# Patient Record
Sex: Female | Born: 1950
Health system: Southern US, Community
[De-identification: ages and names within clinical notes are randomized; demographics above are authoritative.]

## PROBLEM LIST (undated history)

## (undated) DIAGNOSIS — T7840XA Allergy, unspecified, initial encounter: Secondary | ICD-10-CM

## (undated) DIAGNOSIS — M1990 Unspecified osteoarthritis, unspecified site: Secondary | ICD-10-CM

## (undated) DIAGNOSIS — K219 Gastro-esophageal reflux disease without esophagitis: Secondary | ICD-10-CM

## (undated) DIAGNOSIS — C801 Malignant (primary) neoplasm, unspecified: Secondary | ICD-10-CM

## (undated) DIAGNOSIS — B029 Zoster without complications: Secondary | ICD-10-CM

## (undated) DIAGNOSIS — Z5189 Encounter for other specified aftercare: Secondary | ICD-10-CM

## (undated) DIAGNOSIS — E663 Overweight: Secondary | ICD-10-CM

## (undated) DIAGNOSIS — G4700 Insomnia, unspecified: Secondary | ICD-10-CM

## (undated) DIAGNOSIS — M545 Low back pain, unspecified: Secondary | ICD-10-CM

## (undated) DIAGNOSIS — Z9889 Other specified postprocedural states: Secondary | ICD-10-CM

## (undated) DIAGNOSIS — R300 Dysuria: Secondary | ICD-10-CM

## (undated) DIAGNOSIS — M47816 Spondylosis without myelopathy or radiculopathy, lumbar region: Secondary | ICD-10-CM

## (undated) DIAGNOSIS — R112 Nausea with vomiting, unspecified: Secondary | ICD-10-CM

## (undated) DIAGNOSIS — I10 Essential (primary) hypertension: Secondary | ICD-10-CM

## (undated) DIAGNOSIS — R635 Abnormal weight gain: Secondary | ICD-10-CM

## (undated) DIAGNOSIS — M1611 Unilateral primary osteoarthritis, right hip: Secondary | ICD-10-CM

## (undated) DIAGNOSIS — E785 Hyperlipidemia, unspecified: Secondary | ICD-10-CM

## (undated) HISTORY — PX: BACK SURGERY: SHX140

## (undated) HISTORY — PX: JOINT REPLACEMENT: SHX530

## (undated) HISTORY — DX: Gastro-esophageal reflux disease without esophagitis: K21.9

## (undated) HISTORY — PX: CERVICAL DISCECTOMY: SHX98

## (undated) HISTORY — PX: TONSILLECTOMY: SUR1361

## (undated) HISTORY — DX: Overweight: E66.3

## (undated) HISTORY — PX: POLYPECTOMY: SHX149

## (undated) HISTORY — DX: Spondylosis without myelopathy or radiculopathy, lumbar region: M47.816

## (undated) HISTORY — DX: Abnormal weight gain: R63.5

## (undated) HISTORY — DX: Encounter for other specified aftercare: Z51.89

## (undated) HISTORY — DX: Zoster without complications: B02.9

## (undated) HISTORY — DX: Hyperlipidemia, unspecified: E78.5

## (undated) HISTORY — DX: Allergy, unspecified, initial encounter: T78.40XA

## (undated) HISTORY — DX: Other specified postprocedural states: Z98.890

## (undated) HISTORY — DX: Nausea with vomiting, unspecified: R11.2

## (undated) HISTORY — DX: Malignant (primary) neoplasm, unspecified: C80.1

## (undated) HISTORY — DX: Dysuria: R30.0

## (undated) HISTORY — DX: Low back pain, unspecified: M54.50

## (undated) HISTORY — PX: COLONOSCOPY: SHX174

## (undated) HISTORY — DX: Insomnia, unspecified: G47.00

---

## 1999-03-21 ENCOUNTER — Encounter: Payer: Self-pay | Admitting: Obstetrics and Gynecology

## 1999-03-21 ENCOUNTER — Encounter: Admission: RE | Admit: 1999-03-21 | Discharge: 1999-03-21 | Payer: Self-pay | Admitting: Obstetrics and Gynecology

## 1999-12-10 ENCOUNTER — Other Ambulatory Visit: Admission: RE | Admit: 1999-12-10 | Discharge: 1999-12-10 | Payer: Self-pay | Admitting: Obstetrics and Gynecology

## 1999-12-10 ENCOUNTER — Encounter (INDEPENDENT_AMBULATORY_CARE_PROVIDER_SITE_OTHER): Payer: Self-pay | Admitting: Specialist

## 2000-11-23 ENCOUNTER — Encounter: Admission: RE | Admit: 2000-11-23 | Discharge: 2000-11-23 | Payer: Self-pay | Admitting: Obstetrics and Gynecology

## 2000-11-23 ENCOUNTER — Encounter: Payer: Self-pay | Admitting: Obstetrics and Gynecology

## 2001-01-06 ENCOUNTER — Other Ambulatory Visit: Admission: RE | Admit: 2001-01-06 | Discharge: 2001-01-06 | Payer: Self-pay | Admitting: Internal Medicine

## 2001-05-12 HISTORY — PX: BACK SURGERY: SHX140

## 2002-06-13 ENCOUNTER — Encounter: Payer: Self-pay | Admitting: Obstetrics and Gynecology

## 2002-06-13 ENCOUNTER — Encounter: Admission: RE | Admit: 2002-06-13 | Discharge: 2002-06-13 | Payer: Self-pay | Admitting: Internal Medicine

## 2003-04-24 ENCOUNTER — Encounter: Admission: RE | Admit: 2003-04-24 | Discharge: 2003-04-24 | Payer: Self-pay | Admitting: Internal Medicine

## 2003-05-01 ENCOUNTER — Ambulatory Visit (HOSPITAL_COMMUNITY): Admission: RE | Admit: 2003-05-01 | Discharge: 2003-05-01 | Payer: Self-pay | Admitting: Internal Medicine

## 2003-06-16 ENCOUNTER — Ambulatory Visit (HOSPITAL_COMMUNITY): Admission: RE | Admit: 2003-06-16 | Discharge: 2003-06-16 | Payer: Self-pay | Admitting: Neurosurgery

## 2003-12-14 ENCOUNTER — Encounter: Admission: RE | Admit: 2003-12-14 | Discharge: 2003-12-14 | Payer: Self-pay | Admitting: Internal Medicine

## 2004-02-28 ENCOUNTER — Encounter: Admission: RE | Admit: 2004-02-28 | Discharge: 2004-02-28 | Payer: Self-pay | Admitting: Obstetrics and Gynecology

## 2005-04-09 ENCOUNTER — Encounter: Admission: RE | Admit: 2005-04-09 | Discharge: 2005-04-09 | Payer: Self-pay | Admitting: Obstetrics and Gynecology

## 2006-09-14 ENCOUNTER — Encounter: Admission: RE | Admit: 2006-09-14 | Discharge: 2006-09-14 | Payer: Self-pay | Admitting: Obstetrics and Gynecology

## 2007-09-23 ENCOUNTER — Encounter: Admission: RE | Admit: 2007-09-23 | Discharge: 2007-09-23 | Payer: Self-pay | Admitting: Obstetrics and Gynecology

## 2009-09-19 ENCOUNTER — Encounter: Admission: RE | Admit: 2009-09-19 | Discharge: 2009-09-19 | Payer: Self-pay | Admitting: Obstetrics and Gynecology

## 2010-09-27 NOTE — Op Note (Signed)
NAME:  ALLIYAH, ROESLER                        ACCOUNT NO.:  [id number]   MEDICAL RECORD NO.:  [id number]                   PATIENT TYPE:  OIB   LOCATION:  3014                                 FACILITY:  [facility]   PHYSICIAN:  Kathaleen Maser. Pool, M.D.                 DATE OF BIRTH:  18-Oct-1950   DATE OF PROCEDURE:  06/16/2003  DATE OF DISCHARGE:  06/16/2003                                 OPERATIVE REPORT   PREOPERATIVE DIAGNOSES:  1. Right C6-7 spondylosis with radiculopathy.  2. Right C7-T1 herniated nucleus pulposus with radiculopathy.   POSTOPERATIVE DIAGNOSES:  1. Right C6-7 spondylosis with radiculopathy.  2. Right C7-T1 herniated nucleus pulposus with radiculopathy.   PROCEDURE:  1. Right C6-7 laminotomy and foraminotomy.  2. Right C7-T1 laminotomy and foraminotomy with microdiskectomy.   SURGEON:  Kathaleen Maser. Pool, M.D.   ASSISTANT:  Tia Alert, MD   ANESTHESIA:  General endotracheal anesthesia.   INDICATIONS FOR PROCEDURE:  Ms. Tortorelli is a 60 year old female with a history  of  neck  and right upper extremity pain, paresthesias and weakness with a  right-sided C7 and C8 radiculopathy. The patient has failed conservative  management. Workups demonstrated evidence of severe  spondylosis off to the  left side at C6-7 with compression of the C7 nerve root and evidence of an  acute rightward C7-T1  disk herniation with compression of the right-sided  C8 nerve root. The patient has been counseled as to her options. She has  decided to  proceed with a 2 level  cervical foraminotomy and  microdiskectomy.   DESCRIPTION OF PROCEDURE:  The patient was brought to the operating room and  placed on the table in the supine position. After an adequate level of  anesthesia was achieved, the patient was placed prone onto bolsters with the  head fixed in a [place] pin  head rest. The patient's posterior  cervical  region was prepped and draped sterilely.   A #10 blade was used to make a  linear skin incision overlying the C6-7 and  T1 levels. This was carried down sharply in the midline. A subperiosteal  dissection was performed exposing the lamina and facet joints of C6, C7 and  T1. A deep self-retaining retractor was placed. Intraoperative x-ray was  taken and the levels were confirmed.   The laminotomy and foraminotomy were then performed using the high-speed  drill and the Kerrison rongeurs to remove the inferior  aspect of the lamina  above, the medial aspect of the facet joint and the superior aspect of the  lamina below at both the C6-7 and C7-T1 levels. The ligament of Flavum was  then elevated  and dissected in a piecemeal fashion. The underlying  thecal  sac and exiting nerve root were identified.   The microscope was brought into the field and used for microdissection.  Starting first at the C6-7, the nerve root had a  superior  course into the  foramen. There was a large osteophytic protrusion superiorly compressing the  nerve root. This osteophytic protrusion was removed using small  osteotomes.  This completely decompressed the right-sided C7 nerve root. There was no  evidence of any further  compression. There was a small  element  of acute  disk  herniation which was also  resected. At this point blunt probes passed  easily along the course of the nerve root.   Attention was then placed at the C7-T1 level. Once again the C8 nerve root  had a very superior  course. Working superior to the nerve root the epidural  venous plexus was coagulated and cut. A large amount of free disk herniation  was encouraged and dissected free using  a blunt nerve hook. This completely  decompressed the right-sided C8 nerve root. The remainder of the disk  space  was flat.   The wound was then irrigated with antibiotic solution. Gelfoam was placed  top way above the levels for hemostasis. The microscope and retractors were  removed. Hemostasis was mostly achieved with  electrocautery. The wound was  then closed in layers with Vicryl sutures. Steri-Strips and a sterile  dressing were applied.   There were no apparent complications. The patient tolerated the procedure  well and she returned to the recovery room postoperatively.                                               Henry A. Pool, M.D.    HAP/MEDQ  D:  06/16/2003  T:  06/17/2003  Job:  [id number]

## 2015-02-07 ENCOUNTER — Other Ambulatory Visit: Payer: Self-pay | Admitting: Internal Medicine

## 2015-02-07 DIAGNOSIS — M5136 Other intervertebral disc degeneration, lumbar region: Secondary | ICD-10-CM

## 2015-02-22 ENCOUNTER — Other Ambulatory Visit: Payer: Self-pay

## 2015-03-08 ENCOUNTER — Ambulatory Visit
Admission: RE | Admit: 2015-03-08 | Discharge: 2015-03-08 | Disposition: A | Discharge status: Home / Self Care | Payer: Medicare Other | Source: Ambulatory Visit | Attending: Internal Medicine | Admitting: Internal Medicine

## 2015-03-08 DIAGNOSIS — M5136 Other intervertebral disc degeneration, lumbar region: Secondary | ICD-10-CM

## 2015-07-17 ENCOUNTER — Other Ambulatory Visit: Payer: Self-pay

## 2015-07-17 DIAGNOSIS — Z1231 Encounter for screening mammogram for malignant neoplasm of breast: Secondary | ICD-10-CM

## 2015-08-02 ENCOUNTER — Ambulatory Visit
Admission: RE | Admit: 2015-08-02 | Discharge: 2015-08-02 | Disposition: A | Discharge status: Home / Self Care | Payer: Medicare Other | Source: Ambulatory Visit

## 2015-08-02 DIAGNOSIS — Z1231 Encounter for screening mammogram for malignant neoplasm of breast: Secondary | ICD-10-CM

## 2016-01-10 NOTE — Pre-Procedure Instructions (Signed)
    Wendy Ray  01/10/2016      [id number] [place], [place] [address] [place] [address] Phone: [phone] Fax: [phone]    Your procedure is scheduled on 01-22-2016  Tuesday .  Report to [facility] Admitting at 5:30 A.M.   Call this number if you have problems the morning of surgery:  [phone]   Remember:  Do not eat food or drink liquids after midnight.   Take these medicines the morning of surgery with A SIP OF WATER Tylenol if needed,methocarbamol(Robaxin)         STOP ASPIRIN,ANTIINFLAMATORIES (IBUPROFEN,ALEVE,MOTRIN,ADVIL,GOODY'S POWDERS),HERBAL SUPPLEMENTS,FISH OIL,AND VITAMINS 5-7 DAYS PRIOR TO SURGERY    Do not wear jewelry, make-up or nail polish.  Do not wear lotions, powders, or perfumes, or deoderant.  Do not shave 48 hours prior to surgery.  Men may shave face and neck.   Do not bring valuables to the hospital.  [facility] is not responsible for any belongings or valuables.  Contacts, dentures or bridgework may not be worn into surgery.  Leave your suitcase in the car.  After surgery it may be brought to your room.  For patients admitted to the hospital, discharge time will be determined by your treatment team.  Patients discharged the day of surgery will not be allowed to drive home.    Special instructions:  See attached Sheet for instructions on CHG shower  Please read over the following fact sheets that you were given. MRSA Information and Surgical Site Infection Prevention

## 2016-01-11 ENCOUNTER — Other Ambulatory Visit (HOSPITAL_COMMUNITY): Payer: Self-pay | Admitting: *Deleted

## 2016-01-11 ENCOUNTER — Encounter (HOSPITAL_COMMUNITY)
Admission: RE | Admit: 2016-01-11 | Discharge: 2016-01-11 | Disposition: A | Discharge status: Home / Self Care | Payer: Medicare Other | Source: Ambulatory Visit | Attending: Orthopedic Surgery | Admitting: Orthopedic Surgery

## 2016-01-11 ENCOUNTER — Encounter (HOSPITAL_COMMUNITY): Payer: Self-pay

## 2016-01-11 DIAGNOSIS — M1612 Unilateral primary osteoarthritis, left hip: Secondary | ICD-10-CM | POA: Insufficient documentation

## 2016-01-11 DIAGNOSIS — Z0183 Encounter for blood typing: Secondary | ICD-10-CM | POA: Insufficient documentation

## 2016-01-11 DIAGNOSIS — Z01812 Encounter for preprocedural laboratory examination: Secondary | ICD-10-CM | POA: Insufficient documentation

## 2016-01-11 DIAGNOSIS — Z01818 Encounter for other preprocedural examination: Secondary | ICD-10-CM | POA: Insufficient documentation

## 2016-01-11 DIAGNOSIS — I440 Atrioventricular block, first degree: Secondary | ICD-10-CM | POA: Diagnosis not present

## 2016-01-11 HISTORY — DX: Essential (primary) hypertension: I10

## 2016-01-11 HISTORY — DX: Unspecified osteoarthritis, unspecified site: M19.90

## 2016-01-11 LAB — BASIC METABOLIC PANEL
Anion gap: 5 (ref 5–15)
BUN: 26 mg/dL — ABNORMAL HIGH (ref 6–20)
CO2: 22 mmol/L (ref 22–32)
Calcium: 9.4 mg/dL (ref 8.9–10.3)
Chloride: 114 mmol/L — ABNORMAL HIGH (ref 101–111)
Creatinine, Ser: 0.91 mg/dL (ref 0.44–1.00)
GFR calc non Af Amer: >60 mL/min (ref >60)
Glucose, Bld: 97 mg/dL (ref 65–99)
Potassium: 3.9 mmol/L (ref 3.5–5.1)
Sodium: 141 mmol/L (ref 135–145)

## 2016-01-11 LAB — CBC
HCT: 43.4 % (ref 36.0–46.0)
HEMOGLOBIN: 14.4 g/dL (ref 12.0–15.0)
MCH: 32.8 pg (ref 26.0–34.0)
MCHC: 33.2 g/dL (ref 30.0–36.0)
MCV: 98.9 fL (ref 78.0–100.0)
Platelets: 182 10*3/uL (ref 150–400)
RBC: 4.39 MIL/uL (ref 3.87–5.11)
RDW: 13.6 % (ref 11.5–15.5)
WBC: 6.1 10*3/uL (ref 4.0–10.5)

## 2016-01-11 LAB — TYPE AND SCREEN
ABO/RH(D): A POS
ANTIBODY SCREEN: NEGATIVE

## 2016-01-11 LAB — SURGICAL PCR SCREEN
MRSA, PCR: NEGATIVE
Staphylococcus aureus: POSITIVE — AB

## 2016-01-11 LAB — ABO/RH: ABO/RH(D): A POS

## 2016-01-11 NOTE — Progress Notes (Signed)
Message left for pt. Regarding results of nasal swab. [facility] [address]

## 2016-01-22 ENCOUNTER — Inpatient Hospital Stay (HOSPITAL_COMMUNITY): Payer: Medicare Other

## 2016-01-22 ENCOUNTER — Inpatient Hospital Stay (HOSPITAL_COMMUNITY)
Admission: RE | Admit: 2016-01-22 | Discharge: 2016-01-23 | DRG: 470 | Disposition: A | Discharge status: Home Health | Payer: Medicare Other | Source: Ambulatory Visit | Attending: Orthopedic Surgery | Admitting: Orthopedic Surgery

## 2016-01-22 ENCOUNTER — Encounter (HOSPITAL_COMMUNITY): Payer: Self-pay | Admitting: Orthopedic Surgery

## 2016-01-22 ENCOUNTER — Inpatient Hospital Stay (HOSPITAL_COMMUNITY): Payer: Medicare Other | Admitting: Certified Registered"

## 2016-01-22 ENCOUNTER — Encounter (HOSPITAL_COMMUNITY)
Admission: RE | Disposition: A | Discharge status: Home Health | Payer: Self-pay | Source: Ambulatory Visit | Attending: Orthopedic Surgery

## 2016-01-22 DIAGNOSIS — Z23 Encounter for immunization: Secondary | ICD-10-CM | POA: Diagnosis not present

## 2016-01-22 DIAGNOSIS — M1610 Unilateral primary osteoarthritis, unspecified hip: Secondary | ICD-10-CM

## 2016-01-22 DIAGNOSIS — M25551 Pain in right hip: Secondary | ICD-10-CM | POA: Diagnosis present

## 2016-01-22 DIAGNOSIS — Z791 Long term (current) use of non-steroidal anti-inflammatories (NSAID): Secondary | ICD-10-CM | POA: Diagnosis not present

## 2016-01-22 DIAGNOSIS — I10 Essential (primary) hypertension: Secondary | ICD-10-CM | POA: Diagnosis present

## 2016-01-22 DIAGNOSIS — M1611 Unilateral primary osteoarthritis, right hip: Secondary | ICD-10-CM | POA: Diagnosis present

## 2016-01-22 DIAGNOSIS — Z96649 Presence of unspecified artificial hip joint: Secondary | ICD-10-CM

## 2016-01-22 HISTORY — PX: TOTAL HIP ARTHROPLASTY: SHX124

## 2016-01-22 HISTORY — DX: Unilateral primary osteoarthritis, right hip: M16.11

## 2016-01-22 SURGERY — ARTHROPLASTY, HIP, TOTAL,POSTERIOR APPROACH
Anesthesia: General | Site: Hip | Laterality: Right

## 2016-01-22 MED ORDER — SENNA 8.6 MG PO TABS
1.0000 | ORAL_TABLET | Freq: Two times a day (BID) | ORAL | Status: DC
  Administered 2016-01-22 – 2016-01-23 (×3): 8.6 mg via ORAL
  Filled 2016-01-22 (×3): qty 1

## 2016-01-22 MED ORDER — POTASSIUM CHLORIDE IN NACL 20-0.45 MEQ/L-% IV SOLN
INTRAVENOUS | Status: DC
  Administered 2016-01-22: 16:00:00 via INTRAVENOUS
  Filled 2016-01-22 (×2): qty 1000

## 2016-01-22 MED ORDER — DIPHENHYDRAMINE HCL 12.5 MG/5ML PO ELIX: 12.5000 mg | ORAL_SOLUTION | ORAL | Status: DC | PRN

## 2016-01-22 MED ORDER — OXYCODONE HCL 5 MG PO TABS
5.0000 mg | ORAL_TABLET | ORAL | Status: DC | PRN
  Administered 2016-01-22 – 2016-01-23 (×3): 10 mg via ORAL
  Filled 2016-01-22 (×4): qty 2

## 2016-01-22 MED ORDER — BISACODYL 10 MG RE SUPP: 10.0000 mg | Freq: Every day | RECTAL | Status: DC | PRN

## 2016-01-22 MED ORDER — ONDANSETRON HCL 4 MG PO TABS: 4.0000 mg | ORAL_TABLET | Freq: Three times a day (TID) | ORAL | 0 refills | Status: DC | PRN

## 2016-01-22 MED ORDER — DOCUSATE SODIUM 100 MG PO CAPS
100.0000 mg | ORAL_CAPSULE | Freq: Two times a day (BID) | ORAL | Status: DC
  Administered 2016-01-22 – 2016-01-23 (×3): 100 mg via ORAL
  Filled 2016-01-22 (×3): qty 1

## 2016-01-22 MED ORDER — ACETAMINOPHEN 325 MG PO TABS: 650.0000 mg | ORAL_TABLET | Freq: Four times a day (QID) | ORAL | Status: DC | PRN

## 2016-01-22 MED ORDER — SODIUM CHLORIDE 0.9 % IR SOLN
Status: DC | PRN
  Administered 2016-01-22: 1000 mL

## 2016-01-22 MED ORDER — KETOROLAC TROMETHAMINE 15 MG/ML IJ SOLN
7.5000 mg | Freq: Four times a day (QID) | INTRAMUSCULAR | Status: AC
  Administered 2016-01-22 – 2016-01-23 (×4): 7.5 mg via INTRAVENOUS
  Filled 2016-01-22 (×4): qty 1

## 2016-01-22 MED ORDER — SODIUM CHLORIDE 0.9 % IV SOLN
INTRAVENOUS | Status: DC | PRN
  Administered 2016-01-22: 09:00:00 via INTRAVENOUS

## 2016-01-22 MED ORDER — ROCURONIUM BROMIDE 100 MG/10ML IV SOLN
INTRAVENOUS | Status: DC | PRN
  Administered 2016-01-22: 60 mg via INTRAVENOUS

## 2016-01-22 MED ORDER — ONDANSETRON HCL 4 MG/2ML IJ SOLN
INTRAMUSCULAR | Status: DC | PRN
  Administered 2016-01-22: 4 mg via INTRAVENOUS

## 2016-01-22 MED ORDER — PHENYLEPHRINE 40 MCG/ML (10ML) SYRINGE FOR IV PUSH (FOR BLOOD PRESSURE SUPPORT)
PREFILLED_SYRINGE | INTRAVENOUS | Status: AC
  Filled 2016-01-22: qty 10

## 2016-01-22 MED ORDER — ACETAMINOPHEN 10 MG/ML IV SOLN
INTRAVENOUS | Status: DC | PRN
  Administered 2016-01-22: 1000 mg via INTRAVENOUS

## 2016-01-22 MED ORDER — ESMOLOL HCL 100 MG/10ML IV SOLN
INTRAVENOUS | Status: DC | PRN
  Administered 2016-01-22: 30 mg via INTRAVENOUS
  Administered 2016-01-22: 20 mg via INTRAVENOUS
  Administered 2016-01-22: 30 mg via INTRAVENOUS

## 2016-01-22 MED ORDER — FENTANYL CITRATE (PF) 100 MCG/2ML IJ SOLN
INTRAMUSCULAR | Status: AC
  Filled 2016-01-22: qty 2

## 2016-01-22 MED ORDER — PROPOFOL 10 MG/ML IV BOLUS
INTRAVENOUS | Status: DC | PRN
  Administered 2016-01-22: 30 mg via INTRAVENOUS
  Administered 2016-01-22: 170 mg via INTRAVENOUS
  Administered 2016-01-22: 30 mg via INTRAVENOUS

## 2016-01-22 MED ORDER — METHOCARBAMOL 1000 MG/10ML IJ SOLN
500.0000 mg | Freq: Four times a day (QID) | INTRAVENOUS | Status: DC | PRN
  Filled 2016-01-22: qty 5

## 2016-01-22 MED ORDER — HYDROMORPHONE HCL 1 MG/ML IJ SOLN
INTRAMUSCULAR | Status: DC | PRN
  Administered 2016-01-22: 1 mg via INTRAVENOUS

## 2016-01-22 MED ORDER — POLYETHYLENE GLYCOL 3350 17 G PO PACK: 17.0000 g | PACK | Freq: Every day | ORAL | Status: DC | PRN

## 2016-01-22 MED ORDER — LACTATED RINGERS IV SOLN
INTRAVENOUS | Status: DC | PRN
  Administered 2016-01-22 (×3): via INTRAVENOUS

## 2016-01-22 MED ORDER — METHOCARBAMOL 500 MG PO TABS: 500.0000 mg | ORAL_TABLET | Freq: Three times a day (TID) | ORAL | 2 refills | Status: DC

## 2016-01-22 MED ORDER — PHENOL 1.4 % MT LIQD
1.0000 | OROMUCOSAL | Status: DC | PRN
Start: 2016-01-22 — End: 2016-01-23

## 2016-01-22 MED ORDER — METOCLOPRAMIDE HCL 5 MG PO TABS: 5.0000 mg | ORAL_TABLET | Freq: Three times a day (TID) | ORAL | Status: DC | PRN

## 2016-01-22 MED ORDER — CEFAZOLIN SODIUM-DEXTROSE 2-4 GM/100ML-% IV SOLN
2.0000 g | INTRAVENOUS | Status: AC
  Administered 2016-01-22: 2 g via INTRAVENOUS

## 2016-01-22 MED ORDER — HYDROMORPHONE HCL 1 MG/ML IJ SOLN
INTRAMUSCULAR | Status: AC
  Filled 2016-01-22: qty 1

## 2016-01-22 MED ORDER — LIDOCAINE HCL (CARDIAC) 20 MG/ML IV SOLN
INTRAVENOUS | Status: DC | PRN
  Administered 2016-01-22: 100 mg via INTRAVENOUS

## 2016-01-22 MED ORDER — DEXAMETHASONE SODIUM PHOSPHATE 4 MG/ML IJ SOLN
INTRAMUSCULAR | Status: DC | PRN
  Administered 2016-01-22: 10 mg via INTRAVENOUS

## 2016-01-22 MED ORDER — ONDANSETRON HCL 4 MG PO TABS: 4.0000 mg | ORAL_TABLET | Freq: Four times a day (QID) | ORAL | Status: DC | PRN

## 2016-01-22 MED ORDER — CEFAZOLIN SODIUM-DEXTROSE 2-4 GM/100ML-% IV SOLN
2.0000 g | Freq: Four times a day (QID) | INTRAVENOUS | Status: AC
  Administered 2016-01-22 (×2): 2 g via INTRAVENOUS
  Filled 2016-01-22 (×2): qty 100

## 2016-01-22 MED ORDER — ONDANSETRON HCL 4 MG/2ML IJ SOLN
4.0000 mg | Freq: Four times a day (QID) | INTRAMUSCULAR | Status: DC | PRN
  Administered 2016-01-22: 4 mg via INTRAVENOUS
  Filled 2016-01-22: qty 2

## 2016-01-22 MED ORDER — SUGAMMADEX SODIUM 200 MG/2ML IV SOLN
INTRAVENOUS | Status: DC | PRN
  Administered 2016-01-22: 209.6 mg via INTRAVENOUS

## 2016-01-22 MED ORDER — PHENYLEPHRINE HCL 10 MG/ML IJ SOLN
INTRAMUSCULAR | Status: DC | PRN
  Administered 2016-01-22: 120 ug via INTRAVENOUS
  Administered 2016-01-22 (×2): 80 ug via INTRAVENOUS

## 2016-01-22 MED ORDER — SUCCINYLCHOLINE CHLORIDE 200 MG/10ML IV SOSY
PREFILLED_SYRINGE | INTRAVENOUS | Status: AC
  Filled 2016-01-22: qty 10

## 2016-01-22 MED ORDER — HYDROMORPHONE HCL 1 MG/ML IJ SOLN: 0.5000 mg | INTRAMUSCULAR | Status: DC | PRN

## 2016-01-22 MED ORDER — ROCURONIUM BROMIDE 10 MG/ML (PF) SYRINGE
PREFILLED_SYRINGE | INTRAVENOUS | Status: AC
  Filled 2016-01-22: qty 10

## 2016-01-22 MED ORDER — LOSARTAN POTASSIUM-HCTZ 100-12.5 MG PO TABS: 1.0000 | ORAL_TABLET | Freq: Every day | ORAL | Status: DC

## 2016-01-22 MED ORDER — FENTANYL CITRATE (PF) 100 MCG/2ML IJ SOLN
INTRAMUSCULAR | Status: DC | PRN
  Administered 2016-01-22 (×3): 100 ug via INTRAVENOUS

## 2016-01-22 MED ORDER — FENTANYL CITRATE (PF) 100 MCG/2ML IJ SOLN
INTRAMUSCULAR | Status: AC
Start: 2016-01-22 — End: 2016-01-22
  Filled 2016-01-22: qty 2

## 2016-01-22 MED ORDER — MIDAZOLAM HCL 2 MG/2ML IJ SOLN
INTRAMUSCULAR | Status: AC
  Filled 2016-01-22: qty 2

## 2016-01-22 MED ORDER — RIVAROXABAN 10 MG PO TABS: 10.0000 mg | ORAL_TABLET | Freq: Every day | ORAL | 0 refills | Status: DC

## 2016-01-22 MED ORDER — DEXAMETHASONE SODIUM PHOSPHATE 10 MG/ML IJ SOLN
INTRAMUSCULAR | Status: AC
  Filled 2016-01-22: qty 1

## 2016-01-22 MED ORDER — METHOCARBAMOL 500 MG PO TABS
ORAL_TABLET | ORAL | Status: AC
  Filled 2016-01-22: qty 1

## 2016-01-22 MED ORDER — OXYCODONE-ACETAMINOPHEN 10-325 MG PO TABS: 1.0000 | ORAL_TABLET | Freq: Four times a day (QID) | ORAL | 0 refills | Status: DC | PRN

## 2016-01-22 MED ORDER — PROPOFOL 10 MG/ML IV BOLUS
INTRAVENOUS | Status: AC
  Filled 2016-01-22: qty 20

## 2016-01-22 MED ORDER — 0.9 % SODIUM CHLORIDE (POUR BTL) OPTIME
TOPICAL | Status: DC | PRN
  Administered 2016-01-22: 1000 mL

## 2016-01-22 MED ORDER — MIDAZOLAM HCL 5 MG/5ML IJ SOLN
INTRAMUSCULAR | Status: DC | PRN
  Administered 2016-01-22: 2 mg via INTRAVENOUS

## 2016-01-22 MED ORDER — ALUM & MAG HYDROXIDE-SIMETH 200-200-20 MG/5ML PO SUSP: 30.0000 mL | ORAL | Status: DC | PRN

## 2016-01-22 MED ORDER — METHOCARBAMOL 500 MG PO TABS
500.0000 mg | ORAL_TABLET | Freq: Four times a day (QID) | ORAL | Status: DC | PRN
  Administered 2016-01-22: 500 mg via ORAL

## 2016-01-22 MED ORDER — HYDROCHLOROTHIAZIDE 12.5 MG PO CAPS
12.5000 mg | ORAL_CAPSULE | Freq: Every day | ORAL | Status: DC
  Administered 2016-01-22 – 2016-01-23 (×2): 12.5 mg via ORAL
  Filled 2016-01-22 (×2): qty 1

## 2016-01-22 MED ORDER — SUGAMMADEX SODIUM 200 MG/2ML IV SOLN
INTRAVENOUS | Status: AC
  Filled 2016-01-22: qty 2

## 2016-01-22 MED ORDER — BUPIVACAINE HCL (PF) 0.25 % IJ SOLN
INTRAMUSCULAR | Status: DC | PRN
  Administered 2016-01-22: 30 mL

## 2016-01-22 MED ORDER — LIDOCAINE 2% (20 MG/ML) 5 ML SYRINGE
INTRAMUSCULAR | Status: AC
  Filled 2016-01-22: qty 5

## 2016-01-22 MED ORDER — ZOLPIDEM TARTRATE 5 MG PO TABS: 5.0000 mg | ORAL_TABLET | Freq: Every evening | ORAL | Status: DC | PRN

## 2016-01-22 MED ORDER — HYDRALAZINE HCL 20 MG/ML IJ SOLN
INTRAMUSCULAR | Status: AC
  Filled 2016-01-22: qty 1

## 2016-01-22 MED ORDER — MENTHOL 3 MG MT LOZG: 1.0000 | LOZENGE | OROMUCOSAL | Status: DC | PRN

## 2016-01-22 MED ORDER — ACETAMINOPHEN 650 MG RE SUPP: 650.0000 mg | Freq: Four times a day (QID) | RECTAL | Status: DC | PRN

## 2016-01-22 MED ORDER — RIVAROXABAN 10 MG PO TABS
10.0000 mg | ORAL_TABLET | Freq: Every day | ORAL | Status: DC
  Administered 2016-01-23: 10 mg via ORAL
  Filled 2016-01-22: qty 1

## 2016-01-22 MED ORDER — FENTANYL CITRATE (PF) 100 MCG/2ML IJ SOLN
25.0000 ug | INTRAMUSCULAR | Status: DC | PRN
  Administered 2016-01-22 (×3): 50 ug via INTRAVENOUS

## 2016-01-22 MED ORDER — ESMOLOL HCL 100 MG/10ML IV SOLN
INTRAVENOUS | Status: AC
  Filled 2016-01-22: qty 10

## 2016-01-22 MED ORDER — PROMETHAZINE HCL 25 MG/ML IJ SOLN: 6.2500 mg | INTRAMUSCULAR | Status: DC | PRN

## 2016-01-22 MED ORDER — METOCLOPRAMIDE HCL 5 MG/ML IJ SOLN: 5.0000 mg | Freq: Three times a day (TID) | INTRAMUSCULAR | Status: DC | PRN

## 2016-01-22 MED ORDER — BUPIVACAINE HCL (PF) 0.25 % IJ SOLN
INTRAMUSCULAR | Status: AC
  Filled 2016-01-22: qty 30

## 2016-01-22 MED ORDER — ALBUMIN HUMAN 5 % IV SOLN
INTRAVENOUS | Status: DC | PRN
  Administered 2016-01-22: 09:00:00 via INTRAVENOUS

## 2016-01-22 MED ORDER — MAGNESIUM CITRATE PO SOLN: 1.0000 | Freq: Once | ORAL | Status: DC | PRN

## 2016-01-22 MED ORDER — ONDANSETRON HCL 4 MG/2ML IJ SOLN
INTRAMUSCULAR | Status: AC
  Filled 2016-01-22: qty 2

## 2016-01-22 MED ORDER — LOSARTAN POTASSIUM 50 MG PO TABS
100.0000 mg | ORAL_TABLET | Freq: Every day | ORAL | Status: DC
  Filled 2016-01-22 (×2): qty 2

## 2016-01-22 MED ORDER — SENNA-DOCUSATE SODIUM 8.6-50 MG PO TABS: 2.0000 | ORAL_TABLET | Freq: Every day | ORAL | 1 refills | Status: DC

## 2016-01-22 MED ORDER — HYDRALAZINE HCL 20 MG/ML IJ SOLN: INTRAMUSCULAR | Status: DC | PRN

## 2016-01-22 MED ORDER — SUCCINYLCHOLINE CHLORIDE 20 MG/ML IJ SOLN
INTRAMUSCULAR | Status: DC | PRN
  Administered 2016-01-22: 120 mg via INTRAVENOUS

## 2016-01-22 MED ORDER — EPHEDRINE SULFATE 50 MG/ML IJ SOLN
INTRAMUSCULAR | Status: DC | PRN
  Administered 2016-01-22: 10 mg via INTRAVENOUS

## 2016-01-22 MED ORDER — DEXAMETHASONE SODIUM PHOSPHATE 10 MG/ML IJ SOLN
10.0000 mg | Freq: Once | INTRAMUSCULAR | Status: AC
  Administered 2016-01-23: 10 mg via INTRAVENOUS
  Filled 2016-01-22: qty 1

## 2016-01-22 MED ORDER — EPHEDRINE 5 MG/ML INJ
INTRAVENOUS | Status: AC
  Filled 2016-01-22: qty 10

## 2016-01-22 MED ORDER — INFLUENZA VAC SPLIT QUAD 0.5 ML IM SUSY
0.5000 mL | PREFILLED_SYRINGE | INTRAMUSCULAR | Status: AC
  Administered 2016-01-23: 0.5 mL via INTRAMUSCULAR

## 2016-01-22 SURGICAL SUPPLY — 59 items
BIT DRILL 5/64X5 DISP (BIT) ×2 IMPLANT
BLADE SAW SAG 73X25 THK (BLADE) ×1
BLADE SAW SGTL 73X25 THK (BLADE) ×1 IMPLANT
CAPT HIP TOTAL 2 ×2 IMPLANT
CLSR STERI-STRIP ANTIMIC 1/2X4 (GAUZE/BANDAGES/DRESSINGS) ×4 IMPLANT
COVER SURGICAL LIGHT HANDLE (MISCELLANEOUS) ×2 IMPLANT
DRAPE INCISE IOBAN 66X45 STRL (DRAPES) ×4 IMPLANT
DRAPE ORTHO SPLIT 77X108 STRL (DRAPES) ×2
DRAPE PROXIMA HALF (DRAPES) ×2 IMPLANT
DRAPE SURG ORHT 6 SPLT 77X108 (DRAPES) ×2 IMPLANT
DRAPE U-SHAPE 47X51 STRL (DRAPES) ×2 IMPLANT
DRSG MEPILEX BORDER 4X12 (GAUZE/BANDAGES/DRESSINGS) ×2 IMPLANT
DRSG MEPILEX BORDER 4X8 (GAUZE/BANDAGES/DRESSINGS) IMPLANT
DURAPREP 26ML APPLICATOR (WOUND CARE) ×4 IMPLANT
ELECT CAUTERY BLADE 6.4 (BLADE) ×2 IMPLANT
ELECT REM PT RETURN 9FT ADLT (ELECTROSURGICAL) ×2
ELECTRODE REM PT RTRN 9FT ADLT (ELECTROSURGICAL) ×1 IMPLANT
GLOVE BIOGEL PI IND STRL 8 (GLOVE) ×1 IMPLANT
GLOVE BIOGEL PI INDICATOR 8 (GLOVE) ×1
GLOVE BIOGEL PI ORTHO PRO SZ8 (GLOVE) ×1
GLOVE ORTHO TXT STRL SZ7.5 (GLOVE) ×2 IMPLANT
GLOVE PI ORTHO PRO STRL SZ8 (GLOVE) ×1 IMPLANT
GLOVE SURG ORTHO 8.0 STRL STRW (GLOVE) ×2 IMPLANT
GOWN STRL REUS W/ TWL XL LVL3 (GOWN DISPOSABLE) ×1 IMPLANT
GOWN STRL REUS W/TWL 2XL LVL3 (GOWN DISPOSABLE) ×2 IMPLANT
GOWN STRL REUS W/TWL XL LVL3 (GOWN DISPOSABLE) ×1
HANDPIECE INTERPULSE COAX TIP (DISPOSABLE) ×1
HOOD PEEL AWAY FACE SHEILD DIS (HOOD) ×4 IMPLANT
KIT BASIN OR (CUSTOM PROCEDURE TRAY) ×2 IMPLANT
KIT ROOM TURNOVER OR (KITS) ×2 IMPLANT
MANIFOLD NEPTUNE II (INSTRUMENTS) ×2 IMPLANT
NDL SAFETY ECLIPSE 18X1.5 (NEEDLE) ×1 IMPLANT
NDL SUT .5 MAYO 1.404X.05X (NEEDLE) ×1 IMPLANT
NEEDLE HYPO 18GX1.5 SHARP (NEEDLE) ×1
NEEDLE MAYO TAPER (NEEDLE) ×1
NS IRRIG 1000ML POUR BTL (IV SOLUTION) ×2 IMPLANT
PACK TOTAL JOINT (CUSTOM PROCEDURE TRAY) ×2 IMPLANT
PAD ARMBOARD 7.5X6 YLW CONV (MISCELLANEOUS) ×4 IMPLANT
PILLOW ABDUCTION HIP (SOFTGOODS) ×2 IMPLANT
PRESSURIZER FEMORAL UNIV (MISCELLANEOUS) IMPLANT
RETRIEVER SUT HEWSON (MISCELLANEOUS) ×2 IMPLANT
SET HNDPC FAN SPRY TIP SCT (DISPOSABLE) ×1 IMPLANT
SPONGE LAP 4X18 X RAY DECT (DISPOSABLE) IMPLANT
SUCTION FRAZIER HANDLE 10FR (MISCELLANEOUS) ×1
SUCTION TUBE FRAZIER 10FR DISP (MISCELLANEOUS) ×1 IMPLANT
SUT FIBERWIRE #2 38 REV NDL BL (SUTURE) ×6
SUT MNCRL AB 4-0 PS2 18 (SUTURE) ×2 IMPLANT
SUT VIC AB 0 CT1 27 (SUTURE) ×1
SUT VIC AB 0 CT1 27XBRD ANBCTR (SUTURE) ×1 IMPLANT
SUT VIC AB 2-0 CT1 27 (SUTURE) ×1
SUT VIC AB 2-0 CT1 TAPERPNT 27 (SUTURE) ×1 IMPLANT
SUT VIC AB 3-0 SH 8-18 (SUTURE) ×2 IMPLANT
SUTURE FIBERWR#2 38 REV NDL BL (SUTURE) ×3 IMPLANT
SYR CONTROL 10ML LL (SYRINGE) ×2 IMPLANT
TOWEL OR 17X24 6PK STRL BLUE (TOWEL DISPOSABLE) ×2 IMPLANT
TOWEL OR 17X26 10 PK STRL BLUE (TOWEL DISPOSABLE) ×2 IMPLANT
TRAY CATH 16FR W/PLASTIC CATH (SET/KITS/TRAYS/PACK) ×2 IMPLANT
TRAY FOLEY CATH 14FR (SET/KITS/TRAYS/PACK) IMPLANT
WATER STERILE IRR 1000ML POUR (IV SOLUTION) ×4 IMPLANT

## 2016-01-22 NOTE — Progress Notes (Signed)
Patient admitted to room 5N23 at this time from PACU. Alert and in stable condition.

## 2016-01-22 NOTE — Anesthesia Preprocedure Evaluation (Addendum)
Anesthesia Evaluation  Patient identified by MRN, date of birth, ID band Patient awake    Reviewed: Allergy & Precautions, NPO status , Patient's Chart, lab work & pertinent test results  Airway Mallampati: II  TM Distance: >3 FB Neck ROM: Full    Dental no notable dental hx. (+) Dental Advisory Given, Chipped, Caps   Pulmonary neg pulmonary ROS,    Pulmonary exam normal        Cardiovascular hypertension, Pt. on medications Normal cardiovascular exam     Neuro/Psych negative neurological ROS     GI/Hepatic negative GI ROS, Neg liver ROS,   Endo/Other  negative endocrine ROS  Renal/GU negative Renal ROS     Musculoskeletal negative musculoskeletal ROS (+) Arthritis ,   Abdominal   Peds  (+) mental retardation Hematology negative hematology ROS (+)   Anesthesia Other Findings Day of surgery medications reviewed with the patient.  Reproductive/Obstetrics                            Anesthesia Physical Anesthesia Plan  ASA: II  Anesthesia Plan: General   Post-op Pain Management:    Induction: Intravenous  Airway Management Planned: Oral ETT  Additional Equipment:   Intra-op Plan:   Post-operative Plan: Extubation in OR  Informed Consent: I have reviewed the patients History and Physical, chart, labs and discussed the procedure including the risks, benefits and alternatives for the proposed anesthesia with the patient or authorized representative who has indicated his/her understanding and acceptance.   Dental advisory given  Plan Discussed with: CRNA and Surgeon  Anesthesia Plan Comments:         Anesthesia Quick Evaluation

## 2016-01-22 NOTE — Anesthesia Procedure Notes (Addendum)
Procedure Name: Intubation Date/Time: 01/22/2016 7:49 AM Performed by: Huey Romans ANN Pre-anesthesia Checklist: Patient identified, Emergency Drugs available, Suction available and Patient being monitored Patient Re-evaluated:Patient Re-evaluated prior to inductionOxygen Delivery Method: Circle System Utilized Preoxygenation: Pre-oxygenation with 100% oxygen Intubation Type: IV induction Ventilation: Mask ventilation without difficulty Laryngoscope Size: Miller and 2 Grade View: Grade II Tube type: Oral Tube size: 7.0 mm Number of attempts: 1 Airway Equipment and Method: Stylet and Oral airway Placement Confirmation: ETT inserted through vocal cords under direct vision,  positive ETCO2 and breath sounds checked- equal and bilateral Secured at: 22 cm Tube secured with: Tape Dental Injury: Teeth and Oropharynx as per pre-operative assessment  Difficulty Due To: Difficult Airway- due to anterior larynx Future Recommendations: Recommend- induction with short-acting agent, and alternative techniques readily available Comments: Pt noted that she had chips to front incisors pre-op.  Mucosa and dentition intact after DL as pre-op.

## 2016-01-22 NOTE — Evaluation (Signed)
Physical Therapy Evaluation Patient Details Name: Wendy Ray MRN: [id number] DOB: 06-16-1950 Today's Date: 01/22/2016   History of Present Illness  Admitted for RTHA, WBAT, Post Prec; PMH: HTN  Clinical Impression  Pt is s/p THA resulting in the deficits listed below (see PT Problem List).  Pt will benefit from skilled PT to increase their independence and safety with mobility to allow discharge to the venue listed below.      Follow Up Recommendations Home health PT;Supervision/Assistance - 24 hour    Equipment Recommendations  Rolling walker with 5" wheels;3in1 (PT)    Recommendations for Other Services OT consult     Precautions / Restrictions Precautions Precautions: Posterior Hip Precaution Booklet Issued: Yes (comment) Precaution Comments: Educated pt and daughter in Posterior Hip Prec Restrictions Weight Bearing Restrictions: No      Mobility  Bed Mobility Overal bed mobility: Needs Assistance Bed Mobility: Supine to Sit     Supine to sit: Mod assist     General bed mobility comments: Cues for technique and precautions; light mod handheld assist to pull to sit; inefficient scoot of hips to EOB leading to some fatigue  Transfers Overall transfer level: Needs assistance Equipment used: Rolling walker (2 wheeled) Transfers: Sit to/from Stand Sit to Stand: Mod assist         General transfer comment: Cues for hip prec; light mod assist to power up  Ambulation/Gait Ambulation/Gait assistance: Min assist;+2 physical assistance Ambulation Distance (Feet): 15 Feet Assistive device: Rolling walker (2 wheeled) Gait Pattern/deviations: Step-to pattern;Decreased step length - left;Decreased stance time - right     General Gait Details: Cues for gait sequence and to self-monitor for activity tolerance  Stairs            Wheelchair Mobility    Modified Rankin (Stroke Patients Only)       Balance Overall balance assessment: Needs assistance    Sitting balance-Leahy Scale: Fair       Standing balance-Leahy Scale: Poor                               Pertinent Vitals/Pain Pain Assessment: 0-10 Pain Score: 5  Pain Descriptors / Indicators: Aching;Grimacing Pain Intervention(s): Limited activity within patient's tolerance;Monitored during session;Repositioned    Home Living Family/patient expects to be discharged to:: Private residence Living Arrangements: Spouse/significant other Available Help at Discharge: Family;Available 24 hours/day (duaghter plans to stay with pt as well) Type of Home: House Home Access: Stairs to enter Entrance Stairs-Rails: None Entrance Stairs-Number of Steps: 3 Home Layout: One level Home Equipment: None      Prior Function Level of Independence: Independent               Hand Dominance        Extremity/Trunk Assessment   Upper Extremity Assessment: Overall WFL for tasks assessed           Lower Extremity Assessment: RLE deficits/detail RLE Deficits / Details: Grossly decr AROM and strength, limited by pain postop       Communication   Communication: No difficulties  Cognition Arousal/Alertness: Awake/alert Behavior During Therapy: WFL for tasks assessed/performed Overall Cognitive Status: Within Functional Limits for tasks assessed                      General Comments      Exercises        Assessment/Plan    PT Assessment Patient needs continued  PT services  PT Diagnosis Difficulty walking;Acute pain   PT Problem List Decreased strength;Decreased range of motion;Decreased activity tolerance;Decreased balance;Decreased mobility;Decreased knowledge of use of DME;Decreased knowledge of precautions;Pain  PT Treatment Interventions DME instruction;Gait training;Stair training;Functional mobility training;Therapeutic activities;Therapeutic exercise;Balance training;Patient/family education   PT Goals (Current goals can be found in the Care  Plan section) Acute Rehab PT Goals Patient Stated Goal: to play with grandkids PT Goal Formulation: With patient Time For Goal Achievement: 01/29/16 Potential to Achieve Goals: Good    Frequency 7X/week   Barriers to discharge        Co-evaluation               End of Session Equipment Utilized During Treatment: Gait belt Activity Tolerance: Patient tolerated treatment well Patient left: in chair;with call bell/phone within reach Nurse Communication: Mobility status         Time: [id number] PT Time Calculation (min) (ACUTE ONLY): 27 min   Charges:   PT Evaluation $PT Eval Moderate Complexity: 1 Procedure PT Treatments $Gait Training: 8-22 mins   PT G Codes:        Quin Hoop 01/22/2016, 4:06 PM  Roney Marion, PT  Acute Rehabilitation Services Pager [phone] Office [phone]

## 2016-01-22 NOTE — Transfer of Care (Signed)
Immediate Anesthesia Transfer of Care Note  Patient: Wendy Ray  Procedure(s) Performed: Procedure(s): RIGHT TOTAL HIP ARTHROPLASTY (Right)  Patient Location: PACU  Anesthesia Type:General  Level of Consciousness: awake, alert  and oriented  Airway & Oxygen Therapy: Patient Spontanous Breathing and Patient connected to face mask oxygen  Post-op Assessment: Report given to RN and Post -op Vital signs reviewed and stable  Post vital signs: Reviewed and stable  Last Vitals:  Vitals:   01/22/16 0552 01/22/16 1027  BP: (!) 177/88 104/72  Pulse: 63 87  Resp: 18 15  Temp: 36.9 C 36.2 C    Last Pain:  Vitals:   01/22/16 1027  TempSrc:   PainSc: Asleep         Complications: No apparent anesthesia complications

## 2016-01-22 NOTE — H&P (Signed)
  PREOPERATIVE H&P  Chief Complaint: RIGHT HIP DJD  HPI: Wendy Ray is a 65 y.o. female who presents for preoperative history and physical with a diagnosis of RIGHT HIP DJD. Symptoms are rated as moderate to severe, and have been worsening.  This is significantly impairing activities of daily living.  She has elected for surgical management.   She has failed injections, activity modification, anti-inflammatories, and assistive devices.  Preoperative X-rays demonstrate end stage degenerative changes with osteophyte formation, loss of joint space, subchondral sclerosis.   Past Medical History:  Diagnosis Date  . Arthritis   . Hypertension    Past Surgical History:  Procedure Laterality Date  . BACK SURGERY    . CERVICAL DISCECTOMY     Social History   Social History  . Marital status: Married    Spouse name: N/A  . Number of children: N/A  . Years of education: N/A   Social History Main Topics  . Smoking status: Never Smoker  . Smokeless tobacco: Never Used  . Alcohol use Yes     Comment: occasionally  . Drug use: No  . Sexual activity: Not on file   Other Topics Concern  . Not on file   Social History Narrative  . No narrative on file   No family history on file. Allergies  Allergen Reactions  . No Known Allergies    Prior to Admission medications   Medication Sig Start Date End Date Taking? Authorizing Provider  acetaminophen (TYLENOL) 650 MG CR tablet Take 1,300 mg by mouth every 8 (eight) hours as needed for pain.   Yes Historical Provider, MD  losartan-hydrochlorothiazide (HYZAAR) 100-12.5 MG tablet Take 1 tablet by mouth daily. 12/31/15  Yes Historical Provider, MD  meloxicam (MOBIC) 15 MG tablet Take 15 mg by mouth daily. 12/19/15  Yes Historical Provider, MD  methocarbamol (ROBAXIN) 500 MG tablet Take 500 mg by mouth 3 (three) times daily. 12/19/15  Yes Historical Provider, MD     Positive ROS: All other systems have been reviewed and were otherwise  negative with the exception of those mentioned in the HPI and as above.  Physical Exam: General: Alert, no acute distress Cardiovascular: No pedal edema Respiratory: No cyanosis, no use of accessory musculature GI: No organomegaly, abdomen is soft and non-tender Skin: No lesions in the area of chief complaint Neurologic: Sensation intact distally Psychiatric: Patient is competent for consent with normal mood and affect Lymphatic: No axillary or cervical lymphadenopathy  MUSCULOSKELETAL: Right hip range of motion 0-80 with 10 of internal and external rotation. Positive pain in the groin as well as laterally down the leg.  MRI had demonstrated advanced osteoarthritis of the right hip.  Assessment: RIGHT HIP DJD   Plan: Plan for Procedure(s): TOTAL HIP ARTHROPLASTY  The risks benefits and alternatives were discussed with the patient including but not limited to the risks of nonoperative treatment, versus surgical intervention including infection, bleeding, nerve injury, periprosthetic fracture, the need for revision surgery, dislocation, leg length discrepancy, blood clots, cardiopulmonary complications, morbidity, mortality, among others, and they were willing to proceed.     Wendy Bridge, MD Cell [phone]   01/22/2016 7:26 AM

## 2016-01-22 NOTE — Discharge Instructions (Signed)

## 2016-01-22 NOTE — Op Note (Signed)
01/22/2016  9:40 AM  PATIENT:  Wendy Ray   MRN: [id number]  PRE-OPERATIVE DIAGNOSIS:  Primary localized osteoarthritis of right hip  POST-OPERATIVE DIAGNOSIS:  Primary localized osteoarthritis of right hip  PROCEDURE:  Procedure(s): RIGHT TOTAL HIP ARTHROPLASTY  PREOPERATIVE INDICATIONS:    Wendy Ray is an 65 y.o. female who has a diagnosis of Primary localized osteoarthritis of right hip and elected for surgical management after failing conservative treatment.  The risks benefits and alternatives were discussed with the patient including but not limited to the risks of nonoperative treatment, versus surgical intervention including infection, bleeding, nerve injury, periprosthetic fracture, the need for revision surgery, dislocation, leg length discrepancy, blood clots, cardiopulmonary complications, morbidity, mortality, among others, and they were willing to proceed.     OPERATIVE REPORT     SURGEON:  Marchia Bond, MD    ASSISTANT:  Joya Gaskins, OPA-C  (Present throughout the entire procedure,  necessary for completion of procedure in a timely manner, assisting with retraction, instrumentation, and closure)     ANESTHESIA:  general    COMPLICATIONS:  None.     UNIQUE ASPECTS OF THE CASE:  The acetabulum was fairly shallow, and despite the fact that I was able to medialize to the medial wall, there was still a fair amount of uncovered acetabular shell particularly posteriorly. I did not feel that I was overly anteverted, but I did use a neutral liner because I did not want to impinge posteriorly. I had excellent capsular coverage, and minimal shuck.  COMPONENTS:  [employer] fit femur size 4 with a 36 mm +5 head ball and a gription acetabular shell size 52 with an apex hole eliminator and a +4 neutral polyethylene liner.    PROCEDURE IN DETAIL:   The patient was met in the holding area and  identified.  The appropriate hip was identified and marked at the  operative site.  The patient was then transported to the OR  and  placed under anesthesia.  At that point, the patient was  placed in the lateral decubitus position with the operative side up and  secured to the operating room table and all bony prominences padded.     The operative lower extremity was prepped from the iliac crest to the distal leg.  Sterile draping was performed.  Time out was performed prior to incision.      A routine posterolateral approach was utilized via sharp dissection  carried down to the subcutaneous tissue.  Gross bleeders were Bovie coagulated.  The iliotibial band was identified and incised along the length of the skin incision.  Self-retaining retractors were  inserted.  With the hip internally rotated, the short external rotators  were identified. The piriformis and capsule was tagged with FiberWire, and the hip capsule released in a T-type fashion.  The femoral neck was exposed, and I resected the femoral neck using the appropriate jig. This was performed at approximately a thumb's breadth above the lesser trochanter.    I then exposed the deep acetabulum, cleared out any tissue including the ligamentum teres.  A wing retractor was placed.  After adequate visualization, I excised the labrum, and then sequentially reamed.  I placed the trial acetabulum, which seated nicely, and then impacted the real cup into place.  Appropriate version and inclination was confirmed clinically matching their bony anatomy, and also with the use of the jig.  A trial polyethylene liner was placed and the wing retractor removed.    I  then prepared the proximal femur using the cookie-cutter, the lateralizing reamer, and then sequentially reamed and broached.  A trial broach, neck, and head was utilized, and I reduced the hip and it was found to have excellent stability with functional range of motion. The trial components were then removed, and the real polyethylene liner was placed.  I  then impacted the real femoral prosthesis into place into the appropriate version, slightly anteverted to the normal anatomy, and I impacted the real head ball into place. The hip was then reduced and taken through functional range of motion and found to have excellent stability. Leg lengths were restored.  I then used a 2 mm drill bits to pass the FiberWire suture from the capsule and piriformis through the greater trochanter, and secured this. Excellent posterior capsular repair was achieved. I also closed the T in the capsule.  I then irrigated the hip copiously again with pulse lavage, and repaired the fascia with Vicryl, followed by Vicryl for the subcutaneous tissue, Monocryl for the skin, Steri-Strips and sterile gauze. The wounds were injected. The patient was then awakened and returned to PACU in stable and satisfactory condition. There were no complications.  Marchia Bond, MD Orthopedic Surgeon [phone]   01/22/2016 9:40 AM

## 2016-01-22 NOTE — Anesthesia Postprocedure Evaluation (Signed)
Anesthesia Post Note  Patient: Wendy Ray  Procedure(s) Performed: Procedure(s) (LRB): RIGHT TOTAL HIP ARTHROPLASTY (Right)  Patient location during evaluation: PACU Anesthesia Type: General Level of consciousness: awake and alert Pain management: pain level controlled Vital Signs Assessment: post-procedure vital signs reviewed and stable Respiratory status: spontaneous breathing, nonlabored ventilation, respiratory function stable and patient connected to nasal cannula oxygen Cardiovascular status: blood pressure returned to baseline and stable Postop Assessment: no signs of nausea or vomiting Anesthetic complications: no    Last Vitals:  Vitals:   01/22/16 1125 01/22/16 1140  BP: 128/73   Pulse: 86 88  Resp: 12 12  Temp:      Last Pain:  Vitals:   01/22/16 1134  TempSrc:   PainSc: Asleep                 Reginal Lutes

## 2016-01-23 ENCOUNTER — Encounter (HOSPITAL_COMMUNITY): Payer: Self-pay | Admitting: Orthopedic Surgery

## 2016-01-23 LAB — CBC
HCT: 30 % — ABNORMAL LOW (ref 36.0–46.0)
HEMOGLOBIN: 9.6 g/dL — AB (ref 12.0–15.0)
MCH: 32.1 pg (ref 26.0–34.0)
MCHC: 32 g/dL (ref 30.0–36.0)
MCV: 100.3 fL — AB (ref 78.0–100.0)
Platelets: 152 10*3/uL (ref 150–400)
RBC: 2.99 MIL/uL — ABNORMAL LOW (ref 3.87–5.11)
RDW: 13.2 % (ref 11.5–15.5)
WBC: 11 10*3/uL — ABNORMAL HIGH (ref 4.0–10.5)

## 2016-01-23 LAB — BASIC METABOLIC PANEL
Anion gap: 8 (ref 5–15)
BUN: 21 mg/dL — AB (ref 6–20)
CHLORIDE: 107 mmol/L (ref 101–111)
CO2: 24 mmol/L (ref 22–32)
CREATININE: 1 mg/dL (ref 0.44–1.00)
Calcium: 8.5 mg/dL — ABNORMAL LOW (ref 8.9–10.3)
GFR calc Af Amer: >60 mL/min (ref >60)
GFR calc non Af Amer: 57 mL/min — ABNORMAL LOW (ref >60)
GLUCOSE: 153 mg/dL — AB (ref 65–99)
POTASSIUM: 4.1 mmol/L (ref 3.5–5.1)
SODIUM: 139 mmol/L (ref 135–145)

## 2016-01-23 NOTE — Discharge Summary (Signed)
Physician Discharge Summary  Patient ID: Wendy Ray MRN: [id number] DOB/AGE: 1950-08-14 65 y.o.  Admit date: 01/22/2016 Discharge date: 01/23/2016  Admission Diagnoses:  Primary localized osteoarthritis of right hip  Discharge Diagnoses:  Principal Problem:   Primary localized osteoarthritis of right hip Active Problems:   S/P total hip arthroplasty   Past Medical History:  Diagnosis Date  . Arthritis   . Hypertension   . Primary localized osteoarthritis of right hip 01/22/2016    Surgeries: Procedure(s): RIGHT TOTAL HIP ARTHROPLASTY on 01/22/2016   Consultants (if any):   Discharged Condition: Improved  Hospital Course: Wendy Ray is an 65 y.o. female who was admitted 01/22/2016 with a diagnosis of Primary localized osteoarthritis of right hip and went to the operating room on 01/22/2016 and underwent the above named procedures.    She was given perioperative antibiotics:  Anti-infectives    Start     Dose/Rate Route Frequency Ordered Stop   01/22/16 1500  ceFAZolin (ANCEF) IVPB 2g/100 mL premix     2 g 200 mL/hr over 30 Minutes Intravenous Every 6 hours 01/22/16 1353 01/22/16 2244   01/22/16 0730  ceFAZolin (ANCEF) IVPB 2g/100 mL premix     2 g 200 mL/hr over 30 Minutes Intravenous On call to O.R. 01/22/16 [id number] 01/22/16 0758    .  She was given sequential compression devices, early ambulation, and xarelto for DVT prophylaxis.  She benefited maximally from the hospital stay and there were no complications.    Recent vital signs:  Vitals:   01/22/16 2055 01/23/16 0452  BP: 110/66 (!) 114/58  Pulse: 91 70  Resp: 16 18  Temp: 98.3 F (36.8 C) 98.1 F (36.7 C)    Recent laboratory studies:  Lab Results  Component Value Date   HGB 9.6 (L) 01/23/2016   HGB 14.4 01/11/2016   Lab Results  Component Value Date   WBC 11.0 (H) 01/23/2016   PLT 152 01/23/2016   No results found for: INR Lab Results  Component Value Date   NA 139 01/23/2016   K 4.1  01/23/2016   CL 107 01/23/2016   CO2 24 01/23/2016   BUN 21 (H) 01/23/2016   CREATININE 1.00 01/23/2016   GLUCOSE 153 (H) 01/23/2016    Discharge Medications:     Medication List    STOP taking these medications   acetaminophen 650 MG CR tablet Commonly known as:  TYLENOL   meloxicam 15 MG tablet Commonly known as:  MOBIC     TAKE these medications   losartan-hydrochlorothiazide 100-12.5 MG tablet Commonly known as:  HYZAAR Take 1 tablet by mouth daily.   methocarbamol 500 MG tablet Commonly known as:  ROBAXIN Take 1 tablet (500 mg total) by mouth 3 (three) times daily.   ondansetron 4 MG tablet Commonly known as:  ZOFRAN Take 1 tablet (4 mg total) by mouth every 8 (eight) hours as needed for nausea or vomiting.   oxyCODONE-acetaminophen 10-325 MG tablet Commonly known as:  PERCOCET Take 1-2 tablets by mouth every 6 (six) hours as needed for pain. MAXIMUM TOTAL ACETAMINOPHEN DOSE IS 4000 MG PER DAY   rivaroxaban 10 MG Tabs tablet Commonly known as:  XARELTO Take 1 tablet (10 mg total) by mouth daily.   sennosides-docusate sodium 8.6-50 MG tablet Commonly known as:  SENOKOT-S Take 2 tablets by mouth daily.       Diagnostic Studies: Dg Pelvis Portable  Result Date: 01/22/2016 CLINICAL DATA:  Post right hip replacement EXAM: PORTABLE PELVIS 1-2  VIEWS COMPARISON:  MRI 12/27/2015 FINDINGS: Changes of right hip replacement. Normal alignment. No hardware or bony complicating feature. IMPRESSION: Right hip replacement.  No complicating feature. Electronically Signed   By: Rolm Baptise M.D.   On: 01/22/2016 11:08    Disposition:     Follow-up Information    Ronie Fleeger P, MD. Schedule an appointment as soon as possible for a visit in 2 weeks.   Specialty:  Orthopedic Surgery Contact information: [address] [address] [phone]            Signed: [name] 01/23/2016, 7:39 AM

## 2016-01-23 NOTE — Progress Notes (Addendum)
Physical Therapy Treatment Patient Details Name: Wendy Ray MRN: [id number] DOB: [birth year] Today's Date: 01/23/2016    History of Present Illness Admitted for RTHA, WBAT, Post Prec; PMH: HTN    PT Comments    Pt performed increased mobility, reviewed stair training and HEP in prep for d/c home.    Follow Up Recommendations  Home health PT;Supervision/Assistance - 24 hour     Equipment Recommendations  Rolling walker with 5" wheels;3in1 (PT)    Recommendations for Other Services       Precautions / Restrictions Precautions Precautions: Posterior Hip Precaution Booklet Issued: Yes (comment) Precaution Comments: Pt able to state 3/3 posterior THA precautions  Restrictions Weight Bearing Restrictions: Yes RLE Weight Bearing: Weight bearing as tolerated    Mobility  Bed Mobility Overal bed mobility: Needs Assistance Bed Mobility: Supine to Sit     Supine to sit: Supervision     General bed mobility comments: Cues for technqiue to maintain precautions.  Pt moving with smoother transition.    Transfers Overall transfer level: Needs assistance Equipment used: Rolling walker (2 wheeled) Transfers: Sit to/from Stand Sit to Stand: Supervision Stand pivot transfers: Supervision       General transfer comment: Cues for advancement of R LE forward to maintain hip precautions.  cues for hand placement.    Ambulation/Gait Ambulation/Gait assistance: Min assist;+2 physical assistance Ambulation Distance (Feet): 96 Feet Assistive device: Rolling walker (2 wheeled) Gait Pattern/deviations: Step-through pattern;Decreased stride length;Trunk flexed;Antalgic   Gait velocity interpretation: Below normal speed for age/gender General Gait Details: Cues for sequencing and gait symmetry.     Stairs Stairs: Yes Stairs assistance: Min assist Stair Management: No rails Number of Stairs: 2 General stair comments: Cues for sequencing and RW placement.  Pt issued hand out  for safe technique to pass information along to her daughter who will be assisting.    Wheelchair Mobility    Modified Rankin (Stroke Patients Only)       Balance Overall balance assessment: Needs assistance   Sitting balance-Leahy Scale: Good       Standing balance-Leahy Scale: Fair                      Cognition Arousal/Alertness: Awake/alert Behavior During Therapy: WFL for tasks assessed/performed Overall Cognitive Status: Within Functional Limits for tasks assessed                      Exercises Total Joint Exercises Ankle Circles/Pumps: AROM;Both;10 reps;Supine Quad Sets: AROM;Right;10 reps;Supine Short Arc Quad: AROM;Right;10 reps;Supine Heel Slides: AROM;Right;10 reps;Supine Hip ABduction/ADduction: AROM;Right;10 reps;Supine    General Comments        Pertinent Vitals/Pain Pain Assessment: 0-10 Pain Score: 3  Pain Location: Rt hip  Pain Descriptors / Indicators: Grimacing;Guarding Pain Intervention(s): Monitored during session;Repositioned    Home Living Family/patient expects to be discharged to:: Private residence Living Arrangements: Spouse/significant other;Children Available Help at Discharge: Family;Available 24 hours/day (duaghter plans to stay with pt as well) Type of Home: House Home Access: Stairs to enter Entrance Stairs-Rails: None Home Layout: One level Home Equipment: None;Bedside commode;Tub bench;Adaptive equipment Additional Comments: Pt is caregiver for spouse.  Daughter will stay with pt x 1 week post discharge     Prior Function Level of Independence: Independent      Comments: Pt is caregiver for spouse    PT Goals (current goals can now be found in the care plan section) Acute Rehab PT Goals Patient Stated Goal: to go  home  Potential to Achieve Goals: Good Progress towards PT goals: Progressing toward goals    Frequency  7X/week    PT Plan Current plan remains appropriate    Co-evaluation              End of Session Equipment Utilized During Treatment: Gait belt Activity Tolerance: Patient tolerated treatment well Patient left: in chair;with call bell/phone within reach     Time: 1022-1051 PT Time Calculation (min) (ACUTE ONLY): 29 min  Charges:  $Gait Training: 8-22 mins $Therapeutic Exercise: 8-22 mins                    G Codes:      Cristela Blue 02/04/2016, 2:48 PM  Governor Rooks, PTA pager [phone]

## 2016-01-23 NOTE — Care Management Note (Addendum)
01/23/2016 1450 NCM spoke to pt's Unit RN and dtr took DME with her at time of dc.    Case Management Note  Patient Details  Name: Wendy Ray MRN: [id number] Date of Birth: July 30, 1950  Subjective/Objective:    S/p total right hip arthroplasty.                 Action/Plan: Discharge Planning: AVS reviewed: NCM spoke to pt and dtr at home to assist with care. Offered choice for [facility] Pt was preoperatively set up with [facility]. Pt agreeable to [facility] for [facility]. Contacted [facility] DME rep for RW and 3n1 for home. She has a shower chair at home.     Expected Discharge Date:  01/23/2016             Expected Discharge Plan:  [place]  In-House Referral:  NA  Discharge planning Services  CM Consult  Post Acute Care Choice:    Choice offered to:  Patient  DME Arranged:  3-N-1, Walker rolling DME Agency:  [place]:  PT [place]:  [facility]  Status of Service:  Completed, signed off  If discussed at [place] of Stay Meetings, dates discussed:    Additional Comments:  Erenest Rasher, RN 01/23/2016, 10:47 AM

## 2016-01-23 NOTE — Evaluation (Signed)
Occupational Therapy Evaluation Patient Details Name: Wendy Ray MRN: [id number] DOB: Apr 08, 1951 Today's Date: 01/23/2016    History of Present Illness Admitted for RTHA, WBAT, Post Prec; PMH: HTN   Clinical Impression   Patient evaluated by Occupational Therapy with no further acute OT needs identified. All education has been completed and the patient has no further questions. All education completed. See below for any follow-up Occupational Therapy or equipment needs. OT is signing off. Thank you for this referral.      Follow Up Recommendations  No OT follow up;Supervision/Assistance - 24 hour    Equipment Recommendations  None recommended by OT    Recommendations for Other Services       Precautions / Restrictions Precautions Precautions: Posterior Hip Precaution Booklet Issued: Yes (comment) Precaution Comments: Pt able to state 3/3 posterior THA precautions  Restrictions Weight Bearing Restrictions: Yes RLE Weight Bearing: Weight bearing as tolerated      Mobility Bed Mobility                  Transfers Overall transfer level: Needs assistance Equipment used: Rolling walker (2 wheeled) Transfers: Sit to/from [employer] Transfers Sit to Stand: Supervision Stand pivot transfers: Supervision            Balance     Sitting balance-Leahy Scale: Good       Standing balance-Leahy Scale: Fair                              ADL Overall ADL's : Needs assistance/impaired Eating/Feeding: Independent   Grooming: Wash/dry hands;Wash/dry face;Oral care;Brushing hair;Standing;Supervision/safety   Upper Body Bathing: Set up;Sitting   Lower Body Bathing: Supervison/ safety;Sit to/from stand   Upper Body Dressing : Set up;Sitting   Lower Body Dressing: Supervision/safety;Sit to/from stand   Toilet Transfer: Supervision/safety;Ambulation;Comfort height toilet;RW;BSC   Toileting- Clothing Manipulation and Hygiene:  Supervision/safety;Sit to/from Nurse, children's Details (indicate cue type and reason): Pt able to verbalize safe technique for tub transfer onto tub transfer bench  Functional mobility during ADLs: Supervision/safety;Rolling walker General ADL Comments: Pt has all AE.  Instructed her in use of walker bag, lidded mugs and lidded food containers to easily transport fooe      Vision     Perception     Praxis      Pertinent Vitals/Pain Pain Assessment: 0-10 Pain Score: 3  Pain Location: Rt hip  Pain Descriptors / Indicators: Operative site guarding Pain Intervention(s): Monitored during session;Ice applied     Hand Dominance Right   Extremity/Trunk Assessment Upper Extremity Assessment Upper Extremity Assessment: Overall WFL for tasks assessed   Lower Extremity Assessment Lower Extremity Assessment: Defer to PT evaluation   Cervical / Trunk Assessment Cervical / Trunk Assessment: Normal   Communication Communication Communication: No difficulties   Cognition Arousal/Alertness: Awake/alert Behavior During Therapy: WFL for tasks assessed/performed Overall Cognitive Status: Within Functional Limits for tasks assessed                     General Comments       Exercises       Shoulder Instructions      Home Living Family/patient expects to be discharged to:: Private residence Living Arrangements: Spouse/significant other;Children Available Help at Discharge: Family;Available 24 hours/day (duaghter plans to stay with pt as well) Type of Home: House Home Access: Stairs to enter [employer] of Steps: 3 Entrance Stairs-Rails: None Home  Layout: One level     Bathroom Shower/Tub: Tub/shower unit Shower/tub characteristics: Curtain       Home Equipment: None;Bedside commode;Tub bench;Adaptive equipment Adaptive Equipment: Reacher;Sock aid;Long-handled sponge Additional Comments: Pt is caregiver for spouse.  Daughter will stay with  pt x 1 week post discharge       Prior Functioning/Environment Level of Independence: Independent        Comments: Pt is caregiver for spouse     OT Diagnosis: Generalized weakness;Acute pain   OT Problem List:     OT Treatment/Interventions:      OT Goals(Current goals can be found in the care plan section) Acute Rehab OT Goals Patient Stated Goal: to go home  OT Goal Formulation: All assessment and education complete, DC therapy  OT Frequency:     Barriers to D/C:            Co-evaluation              End of Session Equipment Utilized During Treatment: Rolling walker Nurse Communication: Mobility status  Activity Tolerance: Patient tolerated treatment well Patient left: in chair;with call bell/phone within reach   Time: 1123-1146 OT Time Calculation (min): 23 min Charges:  OT General Charges $OT Visit: 1 Procedure OT Evaluation $OT Eval Low Complexity: 1 Procedure OT Treatments $Self Care/Home Management : 8-22 mins G-Codes:    Wendy Ray M 01/26/2016, 12:38 PM

## 2016-05-15 DIAGNOSIS — I10 Essential (primary) hypertension: Secondary | ICD-10-CM | POA: Diagnosis not present

## 2016-05-15 DIAGNOSIS — E119 Type 2 diabetes mellitus without complications: Secondary | ICD-10-CM | POA: Diagnosis not present

## 2016-05-15 DIAGNOSIS — E784 Other hyperlipidemia: Secondary | ICD-10-CM | POA: Diagnosis not present

## 2016-05-15 DIAGNOSIS — R8299 Other abnormal findings in urine: Secondary | ICD-10-CM | POA: Diagnosis not present

## 2016-05-22 DIAGNOSIS — Z0000 Encounter for general adult medical examination without abnormal findings: Secondary | ICD-10-CM | POA: Diagnosis not present

## 2016-05-22 DIAGNOSIS — M5136 Other intervertebral disc degeneration, lumbar region: Secondary | ICD-10-CM | POA: Diagnosis not present

## 2016-05-22 DIAGNOSIS — E668 Other obesity: Secondary | ICD-10-CM | POA: Diagnosis not present

## 2016-05-22 DIAGNOSIS — Z6838 Body mass index (BMI) 38.0-38.9, adult: Secondary | ICD-10-CM | POA: Diagnosis not present

## 2016-05-22 DIAGNOSIS — E784 Other hyperlipidemia: Secondary | ICD-10-CM | POA: Diagnosis not present

## 2016-05-22 DIAGNOSIS — Z1389 Encounter for screening for other disorder: Secondary | ICD-10-CM | POA: Diagnosis not present

## 2016-05-22 DIAGNOSIS — R7309 Other abnormal glucose: Secondary | ICD-10-CM | POA: Diagnosis not present

## 2016-05-22 DIAGNOSIS — I10 Essential (primary) hypertension: Secondary | ICD-10-CM | POA: Diagnosis not present

## 2016-09-10 DIAGNOSIS — M545 Low back pain: Secondary | ICD-10-CM | POA: Diagnosis not present

## 2016-09-10 DIAGNOSIS — M25552 Pain in left hip: Secondary | ICD-10-CM | POA: Diagnosis not present

## 2016-09-11 ENCOUNTER — Telehealth: Payer: Self-pay | Admitting: Gastroenterology

## 2016-09-16 ENCOUNTER — Encounter: Payer: Self-pay | Admitting: Gastroenterology

## 2016-09-16 NOTE — Telephone Encounter (Signed)
Dr.Nandigam reviewed records and accepted patient for direct colon. Left message for patient to call back and schedule.

## 2016-09-26 ENCOUNTER — Ambulatory Visit (AMBULATORY_SURGERY_CENTER): Payer: Self-pay | Admitting: *Deleted

## 2016-09-26 VITALS — Ht 65.5 in | Wt 237.0 lb

## 2016-09-26 DIAGNOSIS — Z800 Family history of malignant neoplasm of digestive organs: Secondary | ICD-10-CM

## 2016-09-26 DIAGNOSIS — Z86010 Personal history of colonic polyps: Secondary | ICD-10-CM

## 2016-09-26 MED ORDER — NA SULFATE-K SULFATE-MG SULF 17.5-3.13-1.6 GM/177ML PO SOLN: 1.0000 | Freq: Once | ORAL | 0 refills | Status: AC

## 2016-09-26 NOTE — Progress Notes (Signed)
No egg or soy allergy known to patient  No issues with past sedation with any surgeries  or procedures, no intubation problems  No diet pills per patient No home 02 use per patient  No blood thinners per patient  Pt denies issues with constipation  No A fib or A flutter  EMMI video not  sent to pt's e mail - she states she does not need this Pay no more than $50 coupon to pt for suprep

## 2016-09-29 ENCOUNTER — Encounter: Payer: Self-pay | Admitting: Gastroenterology

## 2016-10-09 ENCOUNTER — Ambulatory Visit (AMBULATORY_SURGERY_CENTER): Payer: PPO | Admitting: Gastroenterology

## 2016-10-09 ENCOUNTER — Encounter: Payer: Self-pay | Admitting: Gastroenterology

## 2016-10-09 VITALS — BP 133/79 | HR 62 | Temp 98.2°F | Resp 11 | Ht 65.5 in | Wt 237.0 lb

## 2016-10-09 DIAGNOSIS — Z800 Family history of malignant neoplasm of digestive organs: Secondary | ICD-10-CM

## 2016-10-09 DIAGNOSIS — Z1211 Encounter for screening for malignant neoplasm of colon: Secondary | ICD-10-CM | POA: Diagnosis not present

## 2016-10-09 DIAGNOSIS — Z86010 Personal history of colonic polyps: Secondary | ICD-10-CM | POA: Diagnosis not present

## 2016-10-09 DIAGNOSIS — I10 Essential (primary) hypertension: Secondary | ICD-10-CM | POA: Diagnosis not present

## 2016-10-09 MED ORDER — SODIUM CHLORIDE 0.9 % IV SOLN: 500.0000 mL | INTRAVENOUS | Status: DC

## 2016-10-09 NOTE — Progress Notes (Signed)
Report given to PACU, vss 

## 2016-10-09 NOTE — Op Note (Signed)
[facility] Patient Name: Wendy Ray Procedure Date: 10/09/2016 7:47 AM MRN: [id number] Endoscopist: Mauri Pole , MD Age: 66 Referring MD:  Date of Birth: June 19, 1950 Gender: Female Account #: [id number] Procedure:                Colonoscopy Indications:              Screening patient at increased risk: Family history                            of colorectal cancer in multiple 1st-degree                            relatives, High risk colon cancer surveillance:                            Personal history of colonic polyps, High risk colon                            cancer surveillance: Personal history of adenoma                            (10 mm or greater in size), High risk colon cancer                            surveillance: Personal history of multiple (3 or                            more) adenomas Medicines:                Monitored Anesthesia Care Procedure:                Pre-Anesthesia Assessment:                           - Prior to the procedure, a History and Physical                            was performed, and patient medications and                            allergies were reviewed. The patient's tolerance of                            previous anesthesia was also reviewed. The risks                            and benefits of the procedure and the sedation                            options and risks were discussed with the patient.                            All questions were answered, and informed consent  was obtained. Prior Anticoagulants: The patient has                            taken no previous anticoagulant or antiplatelet                            agents. ASA Grade Assessment: II - A patient with                            mild systemic disease. After reviewing the risks                            and benefits, the patient was deemed in                            satisfactory condition to undergo the  procedure.                           After obtaining informed consent, the colonoscope                            was passed under direct vision. Throughout the                            procedure, the patient's blood pressure, pulse, and                            oxygen saturations were monitored continuously. The                            Colonoscope was introduced through the anus and                            advanced to the the cecum, identified by                            appendiceal orifice and ileocecal valve. The                            colonoscopy was performed without difficulty. The                            patient tolerated the procedure well. The quality                            of the bowel preparation was excellent. The                            ileocecal valve, appendiceal orifice, and rectum                            were photographed. Scope In: 8:14:56 AM Scope Out: 8:29:36 AM Total Procedure Duration: 0 hours 14 minutes 40 seconds  Findings:  The perianal and digital rectal examinations were                            normal.                           Multiple small and large-mouthed diverticula were                            found in the sigmoid colon, descending colon and                            ascending colon. Peri-diverticular erythema was                            seen.                           Non-bleeding internal hemorrhoids were found during                            retroflexion. The hemorrhoids were medium-sized.                           The exam was otherwise without abnormality. Complications:            No immediate complications. Estimated Blood Loss:     Estimated blood loss: none. Impression:               - Moderate diverticulosis in the sigmoid colon, in                            the descending colon and in the ascending colon.                            Peri-diverticular erythema was seen.                            - Non-bleeding internal hemorrhoids.                           - The examination was otherwise normal.                           - No specimens collected. Recommendation:           - Patient has a contact number available for                            emergencies. The signs and symptoms of potential                            delayed complications were discussed with the                            patient. Return to normal activities tomorrow.  Written discharge instructions were provided to the                            patient.                           - Resume previous diet.                           - Continue present medications.                           - Await pathology results.                           - Repeat colonoscopy in 5 years for surveillance.                           - Return to GI clinic PRN. Mauri Pole, MD 10/09/2016 8:36:16 AM This report has been signed electronically.

## 2016-10-09 NOTE — Patient Instructions (Signed)
Handouts given: Diverticulosis,Hemorrhoids.   YOU HAD AN ENDOSCOPIC PROCEDURE TODAY AT [facility]:   Refer to the procedure report that was given to you for any specific questions about what was found during the examination.  If the procedure report does not answer your questions, please call your gastroenterologist to clarify.  If you requested that your care partner not be given the details of your procedure findings, then the procedure report has been included in a sealed envelope for you to review at your convenience later.  YOU SHOULD EXPECT: Some feelings of bloating in the abdomen. Passage of more gas than usual.  Walking can help get rid of the air that was put into your GI tract during the procedure and reduce the bloating. If you had a lower endoscopy (such as a colonoscopy or flexible sigmoidoscopy) you may notice spotting of blood in your stool or on the toilet paper. If you underwent a bowel prep for your procedure, you may not have a normal bowel movement for a few days.  Please Note:  You might notice some irritation and congestion in your nose or some drainage.  This is from the oxygen used during your procedure.  There is no need for concern and it should clear up in a day or so.  SYMPTOMS TO REPORT IMMEDIATELY:   Following lower endoscopy (colonoscopy or flexible sigmoidoscopy):  Excessive amounts of blood in the stool  Significant tenderness or worsening of abdominal pains  Swelling of the abdomen that is new, acute  Fever of 100F or higher   For urgent or emergent issues, a gastroenterologist can be reached at any hour by calling [phone].   DIET:  We do recommend a small meal at first, but then you may proceed to your regular diet.  Drink plenty of fluids but you should avoid alcoholic beverages for 24 hours.  ACTIVITY:  You should plan to take it easy for the rest of today and you should NOT DRIVE or use heavy machinery until tomorrow (because of  the sedation medicines used during the test).    FOLLOW UP: Our staff will call the number listed on your records the next business day following your procedure to check on you and address any questions or concerns that you may have regarding the information given to you following your procedure. If we do not reach you, we will leave a message.  However, if you are feeling well and you are not experiencing any problems, there is no need to return our call.  We will assume that you have returned to your regular daily activities without incident.  If any biopsies were taken you will be contacted by phone or by letter within the next 1-3 weeks.  Please call us at [phone] if you have not heard about the biopsies in 3 weeks.    SIGNATURES/CONFIDENTIALITY: You and/or your care partner have signed paperwork which will be entered into your electronic medical record.  These signatures attest to the fact that that the information above on your After Visit Summary has been reviewed and is understood.  Full responsibility of the confidentiality of this discharge information lies with you and/or your care-partner.

## 2016-10-10 ENCOUNTER — Telehealth: Payer: Self-pay

## 2016-10-10 NOTE — Telephone Encounter (Signed)
Name identifier, left voicemail.

## 2016-10-10 NOTE — Telephone Encounter (Signed)
  Follow up Call-  Call back number 10/09/2016  Post procedure Call Back phone  # [phone]  Permission to leave phone message Yes  Some recent data might be hidden     Patient questions:  Do you have a fever, pain , or abdominal swelling? No. Pain Score  0 *  Have you tolerated food without any problems? Yes.    Have you been able to return to your normal activities? Yes.    Do you have any questions about your discharge instructions: Diet   No. Medications  No. Follow up visit  No.  Do you have questions or concerns about your Care? No.  Actions: * If pain score is 4 or above: No action needed, pain <4.

## 2016-10-10 NOTE — Telephone Encounter (Deleted)
  Follow up Call-  Call back number 10/09/2016  Post procedure Call Back phone  # [phone]  Permission to leave phone message Yes  Some recent data might be hidden     Patient questions:  Do you have a fever, pain , or abdominal swelling? {yes no:314532} Pain Score  {NUMBERS; 0-10:5044} *  Have you tolerated food without any problems? {yes no:314532}  Have you been able to return to your normal activities? {yes no:314532}  Do you have any questions about your discharge instructions: Diet   {yes no:314532} Medications  {yes no:314532} Follow up visit  {yes no:314532}  Do you have questions or concerns about your Care? {yes no:314532}  Actions: * If pain score is 4 or above: {ACTION; [facility] ENDO PAIN >4:21563::"No action needed, pain <4."}

## 2016-12-01 DIAGNOSIS — M79645 Pain in left finger(s): Secondary | ICD-10-CM | POA: Diagnosis not present

## 2016-12-01 DIAGNOSIS — H524 Presbyopia: Secondary | ICD-10-CM | POA: Diagnosis not present

## 2016-12-01 DIAGNOSIS — R7309 Other abnormal glucose: Secondary | ICD-10-CM | POA: Diagnosis not present

## 2016-12-01 DIAGNOSIS — H52223 Regular astigmatism, bilateral: Secondary | ICD-10-CM | POA: Diagnosis not present

## 2016-12-01 DIAGNOSIS — R635 Abnormal weight gain: Secondary | ICD-10-CM | POA: Diagnosis not present

## 2016-12-01 DIAGNOSIS — H5203 Hypermetropia, bilateral: Secondary | ICD-10-CM | POA: Diagnosis not present

## 2016-12-01 DIAGNOSIS — E668 Other obesity: Secondary | ICD-10-CM | POA: Diagnosis not present

## 2016-12-01 DIAGNOSIS — Z6839 Body mass index (BMI) 39.0-39.9, adult: Secondary | ICD-10-CM | POA: Diagnosis not present

## 2016-12-01 DIAGNOSIS — H25041 Posterior subcapsular polar age-related cataract, right eye: Secondary | ICD-10-CM | POA: Diagnosis not present

## 2016-12-01 DIAGNOSIS — I10 Essential (primary) hypertension: Secondary | ICD-10-CM | POA: Diagnosis not present

## 2016-12-01 DIAGNOSIS — Z0000 Encounter for general adult medical examination without abnormal findings: Secondary | ICD-10-CM | POA: Diagnosis not present

## 2017-05-20 DIAGNOSIS — R82998 Other abnormal findings in urine: Secondary | ICD-10-CM | POA: Diagnosis not present

## 2017-05-20 DIAGNOSIS — E7849 Other hyperlipidemia: Secondary | ICD-10-CM | POA: Diagnosis not present

## 2017-05-20 DIAGNOSIS — R7309 Other abnormal glucose: Secondary | ICD-10-CM | POA: Diagnosis not present

## 2017-05-20 DIAGNOSIS — I10 Essential (primary) hypertension: Secondary | ICD-10-CM | POA: Diagnosis not present

## 2017-05-28 DIAGNOSIS — M5136 Other intervertebral disc degeneration, lumbar region: Secondary | ICD-10-CM | POA: Diagnosis not present

## 2017-05-28 DIAGNOSIS — Z1389 Encounter for screening for other disorder: Secondary | ICD-10-CM | POA: Diagnosis not present

## 2017-05-28 DIAGNOSIS — R7309 Other abnormal glucose: Secondary | ICD-10-CM | POA: Diagnosis not present

## 2017-05-28 DIAGNOSIS — M79645 Pain in left finger(s): Secondary | ICD-10-CM | POA: Diagnosis not present

## 2017-05-28 DIAGNOSIS — K219 Gastro-esophageal reflux disease without esophagitis: Secondary | ICD-10-CM | POA: Diagnosis not present

## 2017-05-28 DIAGNOSIS — Z6839 Body mass index (BMI) 39.0-39.9, adult: Secondary | ICD-10-CM | POA: Diagnosis not present

## 2017-05-28 DIAGNOSIS — I10 Essential (primary) hypertension: Secondary | ICD-10-CM | POA: Diagnosis not present

## 2017-05-28 DIAGNOSIS — E7849 Other hyperlipidemia: Secondary | ICD-10-CM | POA: Diagnosis not present

## 2017-05-28 DIAGNOSIS — Z0000 Encounter for general adult medical examination without abnormal findings: Secondary | ICD-10-CM | POA: Diagnosis not present

## 2017-05-28 DIAGNOSIS — E668 Other obesity: Secondary | ICD-10-CM | POA: Diagnosis not present

## 2018-01-19 DIAGNOSIS — Z01818 Encounter for other preprocedural examination: Secondary | ICD-10-CM | POA: Diagnosis not present

## 2018-01-19 DIAGNOSIS — H25811 Combined forms of age-related cataract, right eye: Secondary | ICD-10-CM | POA: Diagnosis not present

## 2018-01-19 DIAGNOSIS — H25812 Combined forms of age-related cataract, left eye: Secondary | ICD-10-CM | POA: Diagnosis not present

## 2018-01-19 DIAGNOSIS — H02831 Dermatochalasis of right upper eyelid: Secondary | ICD-10-CM | POA: Diagnosis not present

## 2018-01-21 ENCOUNTER — Other Ambulatory Visit: Payer: Self-pay | Admitting: Internal Medicine

## 2018-01-21 DIAGNOSIS — Z1231 Encounter for screening mammogram for malignant neoplasm of breast: Secondary | ICD-10-CM

## 2018-02-04 DIAGNOSIS — Z9841 Cataract extraction status, right eye: Secondary | ICD-10-CM | POA: Diagnosis not present

## 2018-02-04 DIAGNOSIS — H5211 Myopia, right eye: Secondary | ICD-10-CM | POA: Diagnosis not present

## 2018-02-04 DIAGNOSIS — H25811 Combined forms of age-related cataract, right eye: Secondary | ICD-10-CM | POA: Diagnosis not present

## 2018-02-04 DIAGNOSIS — Z961 Presence of intraocular lens: Secondary | ICD-10-CM | POA: Diagnosis not present

## 2018-02-04 DIAGNOSIS — H2511 Age-related nuclear cataract, right eye: Secondary | ICD-10-CM | POA: Diagnosis not present

## 2018-02-04 DIAGNOSIS — H52221 Regular astigmatism, right eye: Secondary | ICD-10-CM | POA: Diagnosis not present

## 2018-02-22 ENCOUNTER — Ambulatory Visit
Admission: RE | Admit: 2018-02-22 | Discharge: 2018-02-22 | Disposition: A | Discharge status: Home / Self Care | Payer: PRIVATE HEALTH INSURANCE | Source: Ambulatory Visit | Attending: Internal Medicine | Admitting: Internal Medicine

## 2018-02-22 DIAGNOSIS — Z1231 Encounter for screening mammogram for malignant neoplasm of breast: Secondary | ICD-10-CM | POA: Diagnosis not present

## 2018-02-23 DIAGNOSIS — M5489 Other dorsalgia: Secondary | ICD-10-CM | POA: Diagnosis not present

## 2018-05-06 DIAGNOSIS — L570 Actinic keratosis: Secondary | ICD-10-CM | POA: Diagnosis not present

## 2018-05-06 DIAGNOSIS — L814 Other melanin hyperpigmentation: Secondary | ICD-10-CM | POA: Diagnosis not present

## 2018-05-06 DIAGNOSIS — L821 Other seborrheic keratosis: Secondary | ICD-10-CM | POA: Diagnosis not present

## 2018-05-17 ENCOUNTER — Telehealth: Payer: Self-pay | Admitting: Gastroenterology

## 2018-05-17 NOTE — Telephone Encounter (Signed)
Pt is returning your call to sched.

## 2018-05-17 NOTE — Telephone Encounter (Signed)
This phone note was started in error.  Patient was calling to discuss her husband not herself

## 2018-05-24 DIAGNOSIS — E7849 Other hyperlipidemia: Secondary | ICD-10-CM | POA: Diagnosis not present

## 2018-05-24 DIAGNOSIS — R82998 Other abnormal findings in urine: Secondary | ICD-10-CM | POA: Diagnosis not present

## 2018-05-24 DIAGNOSIS — I10 Essential (primary) hypertension: Secondary | ICD-10-CM | POA: Diagnosis not present

## 2018-05-24 DIAGNOSIS — R7309 Other abnormal glucose: Secondary | ICD-10-CM | POA: Diagnosis not present

## 2018-05-31 DIAGNOSIS — Z0000 Encounter for general adult medical examination without abnormal findings: Secondary | ICD-10-CM | POA: Diagnosis not present

## 2018-05-31 DIAGNOSIS — Z6839 Body mass index (BMI) 39.0-39.9, adult: Secondary | ICD-10-CM | POA: Diagnosis not present

## 2018-05-31 DIAGNOSIS — Z1331 Encounter for screening for depression: Secondary | ICD-10-CM | POA: Diagnosis not present

## 2018-05-31 DIAGNOSIS — I10 Essential (primary) hypertension: Secondary | ICD-10-CM | POA: Diagnosis not present

## 2018-05-31 DIAGNOSIS — E668 Other obesity: Secondary | ICD-10-CM | POA: Diagnosis not present

## 2018-05-31 DIAGNOSIS — E7849 Other hyperlipidemia: Secondary | ICD-10-CM | POA: Diagnosis not present

## 2018-05-31 DIAGNOSIS — Z1339 Encounter for screening examination for other mental health and behavioral disorders: Secondary | ICD-10-CM | POA: Diagnosis not present

## 2018-05-31 DIAGNOSIS — R7309 Other abnormal glucose: Secondary | ICD-10-CM | POA: Diagnosis not present

## 2018-05-31 DIAGNOSIS — M5136 Other intervertebral disc degeneration, lumbar region: Secondary | ICD-10-CM | POA: Diagnosis not present

## 2018-10-25 DIAGNOSIS — M25552 Pain in left hip: Secondary | ICD-10-CM | POA: Diagnosis not present

## 2018-10-25 DIAGNOSIS — M5432 Sciatica, left side: Secondary | ICD-10-CM | POA: Diagnosis not present

## 2018-11-22 DIAGNOSIS — Z6841 Body Mass Index (BMI) 40.0 and over, adult: Secondary | ICD-10-CM | POA: Diagnosis not present

## 2018-11-22 DIAGNOSIS — M5432 Sciatica, left side: Secondary | ICD-10-CM | POA: Diagnosis not present

## 2018-11-22 DIAGNOSIS — M1612 Unilateral primary osteoarthritis, left hip: Secondary | ICD-10-CM | POA: Diagnosis not present

## 2018-11-29 DIAGNOSIS — M25552 Pain in left hip: Secondary | ICD-10-CM | POA: Diagnosis not present

## 2018-11-29 DIAGNOSIS — M1612 Unilateral primary osteoarthritis, left hip: Secondary | ICD-10-CM | POA: Diagnosis not present

## 2018-11-30 DIAGNOSIS — M25551 Pain in right hip: Secondary | ICD-10-CM | POA: Diagnosis not present

## 2018-11-30 DIAGNOSIS — I10 Essential (primary) hypertension: Secondary | ICD-10-CM | POA: Diagnosis not present

## 2018-12-08 DIAGNOSIS — M1612 Unilateral primary osteoarthritis, left hip: Secondary | ICD-10-CM | POA: Diagnosis not present

## 2018-12-08 DIAGNOSIS — M5432 Sciatica, left side: Secondary | ICD-10-CM | POA: Diagnosis not present

## 2019-02-19 DIAGNOSIS — Z23 Encounter for immunization: Secondary | ICD-10-CM | POA: Diagnosis not present

## 2019-03-23 DIAGNOSIS — I10 Essential (primary) hypertension: Secondary | ICD-10-CM | POA: Diagnosis not present

## 2019-03-23 DIAGNOSIS — M5416 Radiculopathy, lumbar region: Secondary | ICD-10-CM | POA: Diagnosis not present

## 2019-03-23 DIAGNOSIS — Z6839 Body mass index (BMI) 39.0-39.9, adult: Secondary | ICD-10-CM | POA: Diagnosis not present

## 2019-04-13 DIAGNOSIS — Z6841 Body Mass Index (BMI) 40.0 and over, adult: Secondary | ICD-10-CM | POA: Diagnosis not present

## 2019-04-13 DIAGNOSIS — R030 Elevated blood-pressure reading, without diagnosis of hypertension: Secondary | ICD-10-CM | POA: Diagnosis not present

## 2019-04-13 DIAGNOSIS — M5416 Radiculopathy, lumbar region: Secondary | ICD-10-CM | POA: Diagnosis not present

## 2019-04-27 ENCOUNTER — Other Ambulatory Visit: Payer: Self-pay | Admitting: Gastroenterology

## 2019-04-27 ENCOUNTER — Other Ambulatory Visit: Payer: Self-pay | Admitting: Neurosurgery

## 2019-04-27 DIAGNOSIS — Z6839 Body mass index (BMI) 39.0-39.9, adult: Secondary | ICD-10-CM | POA: Diagnosis not present

## 2019-04-27 DIAGNOSIS — M87052 Idiopathic aseptic necrosis of left femur: Secondary | ICD-10-CM | POA: Diagnosis not present

## 2019-04-27 DIAGNOSIS — I10 Essential (primary) hypertension: Secondary | ICD-10-CM | POA: Diagnosis not present

## 2019-04-27 DIAGNOSIS — M25552 Pain in left hip: Secondary | ICD-10-CM | POA: Diagnosis not present

## 2019-04-27 DIAGNOSIS — M5416 Radiculopathy, lumbar region: Secondary | ICD-10-CM

## 2019-05-04 NOTE — Patient Instructions (Signed)
DUE TO COVID-19 ONLY ONE VISITOR IS ALLOWED TO COME WITH YOU AND STAY IN THE WAITING ROOM ONLY DURING PRE OP AND PROCEDURE DAY OF SURGERY. THE 1 VISITOR MAY VISIT WITH YOU AFTER SURGERY IN YOUR PRIVATE ROOM DURING VISITING HOURS ONLY!  YOU NEED TO HAVE A COVID 19 TEST ON_12/31______ @_9 :35______, THIS TEST MUST BE DONE BEFORE SURGERY, COME  [address], [place] , [address].  ([place]) ONCE YOUR COVID TEST IS COMPLETED, PLEASE BEGIN THE QUARANTINE INSTRUCTIONS AS OUTLINED IN YOUR HANDOUT.                Leandrew Koyanagi    Your procedure is scheduled on: 05/17/19   Report to [facility] Main  Entrance   Report to admitting at  8:00 AM     Call this number if you have problems the morning of surgery [place], NO CHEWING GUM [place].    Do not eat food After Midnight.   YOU MAY HAVE CLEAR LIQUIDS FROM MIDNIGHT UNTIL 7:30 AM  . At 7:30 AM Please finish the prescribed Pre-Surgery  drink  . Nothing by mouth after you finish the  drink !   Take these medicines the morning of surgery with A SIP OF WATER: Amlodipine                                 You may not have any metal on your body including hair pins and              piercings  Do not wear jewelry, make-up, lotions, powders or perfumes, deodorant             Do not wear nail polish on your fingernails.  Do not shave  48 hours prior to surgery.              Do not bring valuables to the hospital. [place].  Contacts, dentures or bridgework may not be worn into surgery.     Patients discharged the day of surgery will not be allowed to drive home.   IF YOU ARE HAVING SURGERY AND GOING HOME THE SAME DAY, YOU MUST HAVE AN ADULT TO DRIVE YOU HOME AND BE WITH YOU FOR 24 HOURS.   YOU MAY GO HOME BY TAXI OR UBER OR ORTHERWISE, BUT AN ADULT MUST ACCOMPANY YOU HOME AND STAY WITH  YOU FOR 24 HOURS.  Name and phone number of your driver:  Special Instructions: N/A              Please read over the following fact sheets you were given: _____________________________________________________________________             [facility] - Preparing for Surgery  Before surgery, you can play an important role.   Because skin is not sterile, your skin needs to be as free of germs as possible.   You can reduce the number of germs on your skin by washing with CHG (chlorahexidine gluconate) soap before surgery.   CHG is an antiseptic cleaner which kills germs and bonds with the skin to continue killing germs even after washing. Please DO NOT use if you have an allergy to CHG or antibacterial soaps.   If your  skin becomes reddened/irritated stop using the CHG and inform your nurse when you arrive at Short Stay. Do not shave (including legs and underarms) for at least 48 hours prior to the first CHG shower.    Please follow these instructions carefully:  1.  Shower with CHG Soap the night before surgery and the  morning of Surgery.  2.  If you choose to wash your hair, wash your hair first as usual with your  normal  shampoo.  3.  After you shampoo, rinse your hair and body thoroughly to remove the  shampoo.                                        4.  Use CHG as you would any other liquid soap.  You can apply chg directly  to the skin and wash                       Gently with a scrungie or clean washcloth.  5.  Apply the CHG Soap to your body ONLY FROM THE NECK DOWN.   Do not use on face/ open                           Wound or open sores. Avoid contact with eyes, ears mouth and genitals (private parts).                       Wash face,  Genitals (private parts) with your normal soap.             6.  Wash thoroughly, paying special attention to the area where your surgery  will be performed.  7.  Thoroughly rinse your body with warm water from the neck down.  8.  DO NOT  shower/wash with your normal soap after using and rinsing off  the CHG Soap.             9.  Pat yourself dry with a clean towel.            10.  Wear clean pajamas.            11.  Place clean sheets on your bed the night of your first shower and do not  sleep with pets. Day of Surgery : Do not apply any lotions/deodorants the morning of surgery.  Please wear clean clothes to the hospital/surgery center.  FAILURE TO FOLLOW THESE INSTRUCTIONS MAY RESULT IN THE CANCELLATION OF YOUR SURGERY PATIENT SIGNATURE_________________________________  NURSE SIGNATURE__________________________________  ________________________________________________________________________   Adam Phenix  An incentive spirometer is a tool that can help keep your lungs clear and active. This tool measures how well you are filling your lungs with each breath. Taking long deep breaths may help reverse or decrease the chance of developing breathing (pulmonary) problems (especially infection) following:  A long period of time when you are unable to move or be active. BEFORE THE PROCEDURE   If the spirometer includes an indicator to show your best effort, your nurse or respiratory therapist will set it to a desired goal.  If possible, sit up straight or lean slightly forward. Try not to slouch.  Hold the incentive spirometer in an upright position. INSTRUCTIONS FOR USE  1. Sit on the edge of your bed if possible, or sit up as far as you can  in bed or on a chair. 2. Hold the incentive spirometer in an upright position. 3. Breathe out normally. 4. Place the mouthpiece in your mouth and seal your lips tightly around it. 5. Breathe in slowly and as deeply as possible, raising the piston or the ball toward the top of the column. 6. Hold your breath for 3-5 seconds or for as long as possible. Allow the piston or ball to fall to the bottom of the column. 7. Remove the mouthpiece from your mouth and breathe out  normally. 8. Rest for a few seconds and repeat Steps 1 through 7 at least 10 times every 1-2 hours when you are awake. Take your time and take a few normal breaths between deep breaths. 9. The spirometer may include an indicator to show your best effort. Use the indicator as a goal to work toward during each repetition. 10. After each set of 10 deep breaths, practice coughing to be sure your lungs are clear. If you have an incision (the cut made at the time of surgery), support your incision when coughing by placing a pillow or rolled up towels firmly against it. Once you are able to get out of bed, walk around indoors and cough well. You may stop using the incentive spirometer when instructed by your caregiver.  RISKS AND COMPLICATIONS  Take your time so you do not get dizzy or light-headed.  If you are in pain, you may need to take or ask for pain medication before doing incentive spirometry. It is harder to take a deep breath if you are having pain. AFTER USE  Rest and breathe slowly and easily.  It can be helpful to keep track of a log of your progress. Your caregiver can provide you with a simple table to help with this. If you are using the spirometer at home, follow these instructions: [place] IF:   You are having difficultly using the spirometer.  You have trouble using the spirometer as often as instructed.  Your pain medication is not giving enough relief while using the spirometer.  You develop fever of 100.5 F (38.1 C) or higher. SEEK IMMEDIATE MEDICAL CARE IF:   You cough up bloody sputum that had not been present before.  You develop fever of 102 F (38.9 C) or greater.  You develop worsening pain at or near the incision site. MAKE SURE YOU:   Understand these instructions.  Will watch your condition.  Will get help right away if you are not doing well or get worse. Document Released: 09/08/2006 Document Revised: 07/21/2011 Document Reviewed:  11/09/2006 [facility] Patient Information 2014 [place], [place].   ________________________________________________________________________

## 2019-05-09 ENCOUNTER — Encounter (HOSPITAL_COMMUNITY)
Admission: RE | Admit: 2019-05-09 | Discharge: 2019-05-09 | Disposition: A | Discharge status: Home / Self Care | Payer: PPO | Source: Ambulatory Visit | Attending: Orthopedic Surgery | Admitting: Orthopedic Surgery

## 2019-05-09 ENCOUNTER — Other Ambulatory Visit (HOSPITAL_COMMUNITY): Payer: PRIVATE HEALTH INSURANCE

## 2019-05-09 ENCOUNTER — Other Ambulatory Visit: Payer: Self-pay

## 2019-05-09 ENCOUNTER — Encounter (INDEPENDENT_AMBULATORY_CARE_PROVIDER_SITE_OTHER): Payer: Self-pay

## 2019-05-09 ENCOUNTER — Encounter (HOSPITAL_COMMUNITY): Payer: Self-pay

## 2019-05-09 DIAGNOSIS — Z01818 Encounter for other preprocedural examination: Secondary | ICD-10-CM | POA: Diagnosis not present

## 2019-05-09 DIAGNOSIS — M879 Osteonecrosis, unspecified: Secondary | ICD-10-CM | POA: Insufficient documentation

## 2019-05-09 DIAGNOSIS — I10 Essential (primary) hypertension: Secondary | ICD-10-CM | POA: Diagnosis not present

## 2019-05-09 DIAGNOSIS — R9431 Abnormal electrocardiogram [ECG] [EKG]: Secondary | ICD-10-CM | POA: Diagnosis not present

## 2019-05-09 LAB — BASIC METABOLIC PANEL
Anion gap: 9 (ref 5–15)
BUN: 26 mg/dL — ABNORMAL HIGH (ref 8–23)
CO2: 27 mmol/L (ref 22–32)
Calcium: 9.8 mg/dL (ref 8.9–10.3)
Chloride: 105 mmol/L (ref 98–111)
Creatinine, Ser: 1.07 mg/dL — ABNORMAL HIGH (ref 0.44–1.00)
GFR calc Af Amer: >60 mL/min (ref >60)
GFR calc non Af Amer: 53 mL/min — ABNORMAL LOW (ref >60)
Glucose, Bld: 114 mg/dL — ABNORMAL HIGH (ref 70–99)
Potassium: 4.3 mmol/L (ref 3.5–5.1)
Sodium: 141 mmol/L (ref 135–145)

## 2019-05-09 LAB — CBC
HCT: 43.9 % (ref 36.0–46.0)
Hemoglobin: 14.5 g/dL (ref 12.0–15.0)
MCH: 31.7 pg (ref 26.0–34.0)
MCHC: 33 g/dL (ref 30.0–36.0)
MCV: 95.9 fL (ref 80.0–100.0)
Platelets: 207 10*3/uL (ref 150–400)
RBC: 4.58 MIL/uL (ref 3.87–5.11)
RDW: 13 % (ref 11.5–15.5)
WBC: 6.7 10*3/uL (ref 4.0–10.5)
nRBC: 0 % (ref 0.0–0.2)

## 2019-05-09 NOTE — Progress Notes (Signed)
PCP - Dr. Threasa Beards Cardiologist - none  Chest x-ray - no EKG - 05/09/19 Stress Test - no ECHO - no Cardiac Cath - no  Sleep Study - no CPAP -   Fasting Blood Sugar - NA Checks Blood Sugar _____ times a day  Blood Thinner Instructions:NA Aspirin Instructions: Last Dose:  Anesthesia review:   Patient denies shortness of breath, fever, cough and chest pain at PAT appointment  yes Patient verbalized understanding of instructions that were given to them at the PAT appointment. Patient was also instructed that they will need to review over the PAT instructions again at home before surgery. yes

## 2019-05-11 DIAGNOSIS — S72062A Displaced articular fracture of head of left femur, initial encounter for closed fracture: Secondary | ICD-10-CM | POA: Diagnosis not present

## 2019-05-11 DIAGNOSIS — E785 Hyperlipidemia, unspecified: Secondary | ICD-10-CM | POA: Diagnosis not present

## 2019-05-11 DIAGNOSIS — M25552 Pain in left hip: Secondary | ICD-10-CM | POA: Diagnosis not present

## 2019-05-11 DIAGNOSIS — I10 Essential (primary) hypertension: Secondary | ICD-10-CM | POA: Diagnosis not present

## 2019-05-11 DIAGNOSIS — R739 Hyperglycemia, unspecified: Secondary | ICD-10-CM | POA: Diagnosis not present

## 2019-05-11 DIAGNOSIS — Z01818 Encounter for other preprocedural examination: Secondary | ICD-10-CM | POA: Diagnosis not present

## 2019-05-12 ENCOUNTER — Inpatient Hospital Stay (HOSPITAL_COMMUNITY): Admission: RE | Admit: 2019-05-12 | Payer: PPO | Source: Ambulatory Visit

## 2019-05-14 ENCOUNTER — Other Ambulatory Visit (HOSPITAL_COMMUNITY)
Admission: RE | Admit: 2019-05-14 | Discharge: 2019-05-14 | Disposition: A | Discharge status: Home / Self Care | Payer: PPO | Source: Ambulatory Visit | Attending: Orthopedic Surgery | Admitting: Orthopedic Surgery

## 2019-05-14 DIAGNOSIS — E785 Hyperlipidemia, unspecified: Secondary | ICD-10-CM | POA: Diagnosis not present

## 2019-05-14 DIAGNOSIS — M879 Osteonecrosis, unspecified: Secondary | ICD-10-CM | POA: Diagnosis present

## 2019-05-14 DIAGNOSIS — Z01812 Encounter for preprocedural laboratory examination: Secondary | ICD-10-CM | POA: Insufficient documentation

## 2019-05-14 DIAGNOSIS — Z96642 Presence of left artificial hip joint: Secondary | ICD-10-CM | POA: Diagnosis not present

## 2019-05-14 DIAGNOSIS — Z96641 Presence of right artificial hip joint: Secondary | ICD-10-CM | POA: Diagnosis present

## 2019-05-14 DIAGNOSIS — M1612 Unilateral primary osteoarthritis, left hip: Secondary | ICD-10-CM | POA: Diagnosis not present

## 2019-05-14 DIAGNOSIS — I10 Essential (primary) hypertension: Secondary | ICD-10-CM | POA: Diagnosis present

## 2019-05-14 DIAGNOSIS — Z471 Aftercare following joint replacement surgery: Secondary | ICD-10-CM | POA: Diagnosis not present

## 2019-05-14 DIAGNOSIS — Z20822 Contact with and (suspected) exposure to covid-19: Secondary | ICD-10-CM | POA: Insufficient documentation

## 2019-05-14 DIAGNOSIS — Z6841 Body Mass Index (BMI) 40.0 and over, adult: Secondary | ICD-10-CM | POA: Diagnosis not present

## 2019-05-14 DIAGNOSIS — M87052 Idiopathic aseptic necrosis of left femur: Secondary | ICD-10-CM | POA: Diagnosis not present

## 2019-05-14 LAB — SARS CORONAVIRUS 2 (TAT 6-24 HRS): SARS Coronavirus 2: NEGATIVE

## 2019-05-16 NOTE — Care Plan (Signed)
Ortho Bundle Case Management Note  Patient Details  Name: Wendy Ray MRN: [id number] Date of Birth: 08-21-1950  Spoke with patient prior to surgery. She is currently WC bound due to pain. She plans to discharge to home, hopefully same day, depending on pain and PT. Her daughter will assist. She is set up with [facility] for [place] and [address] for [place]. SHe has all needed equipment at home. Patient and MD in agreement with plan. Choice offered.                  DME Arranged:    DME Agency:     HH Arranged:  PT HH Agency:  [facility] (formerly [facility])  Additional Comments: Please contact me with any questions of if this plan should need to change.  Ladell Heads,  [place] [facility] Specialist  [phone] 05/16/2019, 11:16 AM

## 2019-05-17 ENCOUNTER — Encounter (HOSPITAL_COMMUNITY): Payer: Self-pay | Admitting: Orthopedic Surgery

## 2019-05-17 ENCOUNTER — Inpatient Hospital Stay (HOSPITAL_COMMUNITY): Payer: PPO | Admitting: Physician Assistant

## 2019-05-17 ENCOUNTER — Encounter (HOSPITAL_COMMUNITY)
Admission: RE | Disposition: A | Discharge status: Home Health | Payer: Self-pay | Source: Home / Self Care | Attending: Orthopedic Surgery

## 2019-05-17 ENCOUNTER — Inpatient Hospital Stay (HOSPITAL_COMMUNITY)
Admission: RE | Admit: 2019-05-17 | Discharge: 2019-05-17 | DRG: 470 | Disposition: A | Discharge status: Home Health | Payer: PPO | Attending: Orthopedic Surgery | Admitting: Orthopedic Surgery

## 2019-05-17 ENCOUNTER — Inpatient Hospital Stay (HOSPITAL_COMMUNITY): Payer: PPO | Admitting: Anesthesiology

## 2019-05-17 ENCOUNTER — Ambulatory Visit (HOSPITAL_COMMUNITY): Payer: PPO

## 2019-05-17 DIAGNOSIS — I10 Essential (primary) hypertension: Secondary | ICD-10-CM | POA: Diagnosis present

## 2019-05-17 DIAGNOSIS — M879 Osteonecrosis, unspecified: Principal | ICD-10-CM | POA: Diagnosis present

## 2019-05-17 DIAGNOSIS — Z20822 Contact with and (suspected) exposure to covid-19: Secondary | ICD-10-CM | POA: Diagnosis present

## 2019-05-17 DIAGNOSIS — Z96642 Presence of left artificial hip joint: Secondary | ICD-10-CM

## 2019-05-17 DIAGNOSIS — M87052 Idiopathic aseptic necrosis of left femur: Secondary | ICD-10-CM | POA: Diagnosis present

## 2019-05-17 DIAGNOSIS — Z471 Aftercare following joint replacement surgery: Secondary | ICD-10-CM | POA: Diagnosis not present

## 2019-05-17 DIAGNOSIS — Z96641 Presence of right artificial hip joint: Secondary | ICD-10-CM | POA: Diagnosis present

## 2019-05-17 DIAGNOSIS — Z6841 Body Mass Index (BMI) 40.0 and over, adult: Secondary | ICD-10-CM

## 2019-05-17 HISTORY — PX: TOTAL HIP ARTHROPLASTY: SHX124

## 2019-05-17 SURGERY — ARTHROPLASTY, HIP, TOTAL,POSTERIOR APPROACH
Anesthesia: Spinal | Site: Hip | Laterality: Left

## 2019-05-17 MED ORDER — PRAVASTATIN SODIUM 20 MG PO TABS: 20.0000 mg | ORAL_TABLET | Freq: Every day | ORAL | Status: DC

## 2019-05-17 MED ORDER — PROPOFOL 500 MG/50ML IV EMUL
INTRAVENOUS | Status: AC
  Filled 2019-05-17: qty 50

## 2019-05-17 MED ORDER — METOCLOPRAMIDE HCL 5 MG PO TABS
5.0000 mg | ORAL_TABLET | Freq: Three times a day (TID) | ORAL | Status: DC | PRN
  Filled 2019-05-17: qty 2

## 2019-05-17 MED ORDER — KETOROLAC TROMETHAMINE 30 MG/ML IJ SOLN
INTRAMUSCULAR | Status: DC | PRN
  Administered 2019-05-17: 30 mg

## 2019-05-17 MED ORDER — KETOROLAC TROMETHAMINE 30 MG/ML IJ SOLN
INTRAMUSCULAR | Status: AC
  Filled 2019-05-17: qty 1

## 2019-05-17 MED ORDER — BISACODYL 10 MG RE SUPP
10.0000 mg | Freq: Every day | RECTAL | Status: DC | PRN
  Filled 2019-05-17: qty 1

## 2019-05-17 MED ORDER — ONDANSETRON HCL 4 MG/2ML IJ SOLN
INTRAMUSCULAR | Status: AC
  Filled 2019-05-17: qty 2

## 2019-05-17 MED ORDER — MIDAZOLAM HCL 2 MG/2ML IJ SOLN
INTRAMUSCULAR | Status: AC
  Filled 2019-05-17: qty 2

## 2019-05-17 MED ORDER — POLYETHYLENE GLYCOL 3350 17 G PO PACK
17.0000 g | PACK | Freq: Every day | ORAL | Status: DC | PRN
  Filled 2019-05-17: qty 1

## 2019-05-17 MED ORDER — BUPIVACAINE HCL (PF) 0.25 % IJ SOLN
INTRAMUSCULAR | Status: DC | PRN
  Administered 2019-05-17: 30 mL

## 2019-05-17 MED ORDER — MAGNESIUM CITRATE PO SOLN
1.0000 | Freq: Once | ORAL | Status: DC | PRN
  Filled 2019-05-17: qty 296

## 2019-05-17 MED ORDER — LACTATED RINGERS IV BOLUS: 500.0000 mL | Freq: Once | INTRAVENOUS | Status: DC

## 2019-05-17 MED ORDER — BUPIVACAINE IN DEXTROSE 0.75-8.25 % IT SOLN
INTRATHECAL | Status: DC | PRN
  Administered 2019-05-17: 1.6 mL via INTRATHECAL

## 2019-05-17 MED ORDER — ONDANSETRON HCL 4 MG PO TABS
4.0000 mg | ORAL_TABLET | Freq: Four times a day (QID) | ORAL | Status: DC | PRN
  Filled 2019-05-17: qty 1

## 2019-05-17 MED ORDER — MEPERIDINE HCL 50 MG/ML IJ SOLN: 6.2500 mg | INTRAMUSCULAR | Status: DC | PRN

## 2019-05-17 MED ORDER — ONDANSETRON HCL 4 MG/2ML IJ SOLN: 4.0000 mg | Freq: Once | INTRAMUSCULAR | Status: DC | PRN

## 2019-05-17 MED ORDER — CEFAZOLIN SODIUM-DEXTROSE 2-4 GM/100ML-% IV SOLN
INTRAVENOUS | Status: AC
  Filled 2019-05-17: qty 100

## 2019-05-17 MED ORDER — ALBUMIN HUMAN 5 % IV SOLN: INTRAVENOUS | Status: DC | PRN

## 2019-05-17 MED ORDER — ONDANSETRON HCL 4 MG PO TABS: 4.0000 mg | ORAL_TABLET | Freq: Three times a day (TID) | ORAL | 0 refills | Status: DC | PRN

## 2019-05-17 MED ORDER — CEFAZOLIN SODIUM-DEXTROSE 2-4 GM/100ML-% IV SOLN
2.0000 g | Freq: Four times a day (QID) | INTRAVENOUS | Status: DC
  Administered 2019-05-17: 2 g via INTRAVENOUS

## 2019-05-17 MED ORDER — ASPIRIN EC 325 MG PO TBEC: 325.0000 mg | DELAYED_RELEASE_TABLET | Freq: Every day | ORAL | Status: DC

## 2019-05-17 MED ORDER — LACTATED RINGERS IV SOLN: INTRAVENOUS | Status: DC

## 2019-05-17 MED ORDER — PHENYLEPHRINE 40 MCG/ML (10ML) SYRINGE FOR IV PUSH (FOR BLOOD PRESSURE SUPPORT)
PREFILLED_SYRINGE | INTRAVENOUS | Status: AC
  Filled 2019-05-17: qty 10

## 2019-05-17 MED ORDER — BACLOFEN 10 MG PO TABS: 10.0000 mg | ORAL_TABLET | Freq: Three times a day (TID) | ORAL | 0 refills | Status: DC

## 2019-05-17 MED ORDER — MENTHOL 3 MG MT LOZG: 1.0000 | LOZENGE | OROMUCOSAL | Status: DC | PRN

## 2019-05-17 MED ORDER — PROPOFOL 10 MG/ML IV BOLUS
INTRAVENOUS | Status: AC
  Filled 2019-05-17: qty 20

## 2019-05-17 MED ORDER — TRANEXAMIC ACID-NACL 1000-0.7 MG/100ML-% IV SOLN
INTRAVENOUS | Status: AC
  Filled 2019-05-17: qty 100

## 2019-05-17 MED ORDER — KETOROLAC TROMETHAMINE 15 MG/ML IJ SOLN: 7.5000 mg | Freq: Four times a day (QID) | INTRAMUSCULAR | Status: DC

## 2019-05-17 MED ORDER — POTASSIUM CHLORIDE IN NACL 20-0.45 MEQ/L-% IV SOLN: INTRAVENOUS | Status: DC

## 2019-05-17 MED ORDER — METHOCARBAMOL 500 MG PO TABS: 500.0000 mg | ORAL_TABLET | Freq: Four times a day (QID) | ORAL | Status: DC | PRN

## 2019-05-17 MED ORDER — SUGAMMADEX SODIUM 500 MG/5ML IV SOLN
INTRAVENOUS | Status: AC
  Filled 2019-05-17: qty 5

## 2019-05-17 MED ORDER — DEXAMETHASONE SODIUM PHOSPHATE 10 MG/ML IJ SOLN: 10.0000 mg | Freq: Once | INTRAMUSCULAR | Status: DC

## 2019-05-17 MED ORDER — DOCUSATE SODIUM 100 MG PO CAPS
100.0000 mg | ORAL_CAPSULE | Freq: Two times a day (BID) | ORAL | Status: DC
  Filled 2019-05-17: qty 1

## 2019-05-17 MED ORDER — PROPOFOL 500 MG/50ML IV EMUL
INTRAVENOUS | Status: DC | PRN
  Administered 2019-05-17: 50 ug/kg/min via INTRAVENOUS

## 2019-05-17 MED ORDER — ACETAMINOPHEN 500 MG PO TABS: 1000.0000 mg | ORAL_TABLET | Freq: Four times a day (QID) | ORAL | Status: DC

## 2019-05-17 MED ORDER — TRANEXAMIC ACID-NACL 1000-0.7 MG/100ML-% IV SOLN: 1000.0000 mg | Freq: Once | INTRAVENOUS | Status: DC

## 2019-05-17 MED ORDER — METHOCARBAMOL 500 MG IVPB - SIMPLE MED
INTRAVENOUS | Status: AC
  Filled 2019-05-17: qty 50

## 2019-05-17 MED ORDER — CEFAZOLIN SODIUM-DEXTROSE 2-4 GM/100ML-% IV SOLN
2.0000 g | INTRAVENOUS | Status: AC
  Administered 2019-05-17: 2 g via INTRAVENOUS
  Filled 2019-05-17: qty 100

## 2019-05-17 MED ORDER — ONDANSETRON HCL 4 MG/2ML IJ SOLN
INTRAMUSCULAR | Status: DC | PRN
  Administered 2019-05-17: 4 mg via INTRAVENOUS

## 2019-05-17 MED ORDER — ACETAMINOPHEN 325 MG PO TABS: 325.0000 mg | ORAL_TABLET | Freq: Four times a day (QID) | ORAL | Status: DC | PRN

## 2019-05-17 MED ORDER — AMLODIPINE BESYLATE 5 MG PO TABS: 5.0000 mg | ORAL_TABLET | Freq: Every day | ORAL | Status: DC

## 2019-05-17 MED ORDER — OXYCODONE HCL 5 MG PO TABS: 5.0000 mg | ORAL_TABLET | ORAL | Status: DC | PRN

## 2019-05-17 MED ORDER — FENTANYL CITRATE (PF) 100 MCG/2ML IJ SOLN: 25.0000 ug | INTRAMUSCULAR | Status: DC | PRN

## 2019-05-17 MED ORDER — PHENYLEPHRINE HCL-NACL 10-0.9 MG/250ML-% IV SOLN
INTRAVENOUS | Status: DC | PRN
  Administered 2019-05-17: 50 ug/min via INTRAVENOUS

## 2019-05-17 MED ORDER — FENTANYL CITRATE (PF) 250 MCG/5ML IJ SOLN
INTRAMUSCULAR | Status: AC
  Filled 2019-05-17: qty 5

## 2019-05-17 MED ORDER — SENNA-DOCUSATE SODIUM 8.6-50 MG PO TABS: 2.0000 | ORAL_TABLET | Freq: Every day | ORAL | 1 refills | Status: DC

## 2019-05-17 MED ORDER — PHENYLEPHRINE HCL (PRESSORS) 10 MG/ML IV SOLN
INTRAVENOUS | Status: AC
  Filled 2019-05-17: qty 1

## 2019-05-17 MED ORDER — ALBUMIN HUMAN 5 % IV SOLN
INTRAVENOUS | Status: AC
  Filled 2019-05-17: qty 250

## 2019-05-17 MED ORDER — 0.9 % SODIUM CHLORIDE (POUR BTL) OPTIME
TOPICAL | Status: DC | PRN
  Administered 2019-05-17: 1000 mL

## 2019-05-17 MED ORDER — PROPOFOL 10 MG/ML IV BOLUS
INTRAVENOUS | Status: DC | PRN
  Administered 2019-05-17: 20 mg via INTRAVENOUS

## 2019-05-17 MED ORDER — LIDOCAINE 2% (20 MG/ML) 5 ML SYRINGE
INTRAMUSCULAR | Status: AC
  Filled 2019-05-17: qty 5

## 2019-05-17 MED ORDER — FENTANYL CITRATE (PF) 100 MCG/2ML IJ SOLN
INTRAMUSCULAR | Status: DC | PRN
  Administered 2019-05-17: 100 ug via INTRAVENOUS

## 2019-05-17 MED ORDER — METOCLOPRAMIDE HCL 5 MG/ML IJ SOLN: 5.0000 mg | Freq: Three times a day (TID) | INTRAMUSCULAR | Status: DC | PRN

## 2019-05-17 MED ORDER — LACTATED RINGERS IV BOLUS: 250.0000 mL | Freq: Once | INTRAVENOUS | Status: DC

## 2019-05-17 MED ORDER — PHENOL 1.4 % MT LIQD: 1.0000 | OROMUCOSAL | Status: DC | PRN

## 2019-05-17 MED ORDER — BUPIVACAINE HCL 0.25 % IJ SOLN
INTRAMUSCULAR | Status: AC
  Filled 2019-05-17: qty 1

## 2019-05-17 MED ORDER — DEXAMETHASONE SODIUM PHOSPHATE 10 MG/ML IJ SOLN
INTRAMUSCULAR | Status: AC
  Filled 2019-05-17: qty 1

## 2019-05-17 MED ORDER — METHOCARBAMOL 500 MG IVPB - SIMPLE MED
500.0000 mg | Freq: Four times a day (QID) | INTRAVENOUS | Status: DC | PRN
  Administered 2019-05-17: 500 mg via INTRAVENOUS

## 2019-05-17 MED ORDER — ASPIRIN EC 325 MG PO TBEC: 325.0000 mg | DELAYED_RELEASE_TABLET | Freq: Two times a day (BID) | ORAL | 0 refills | Status: DC

## 2019-05-17 MED ORDER — ACETAMINOPHEN 500 MG PO TABS
1000.0000 mg | ORAL_TABLET | Freq: Once | ORAL | Status: AC
  Administered 2019-05-17: 1000 mg via ORAL
  Filled 2019-05-17: qty 2

## 2019-05-17 MED ORDER — PHENYLEPHRINE 40 MCG/ML (10ML) SYRINGE FOR IV PUSH (FOR BLOOD PRESSURE SUPPORT)
PREFILLED_SYRINGE | INTRAVENOUS | Status: DC | PRN
  Administered 2019-05-17: 120 ug via INTRAVENOUS
  Administered 2019-05-17: 80 ug via INTRAVENOUS

## 2019-05-17 MED ORDER — CHLORHEXIDINE GLUCONATE 4 % EX LIQD: 60.0000 mL | Freq: Once | CUTANEOUS | Status: DC

## 2019-05-17 MED ORDER — DEXAMETHASONE SODIUM PHOSPHATE 10 MG/ML IJ SOLN
INTRAMUSCULAR | Status: DC | PRN
  Administered 2019-05-17: 10 mg via INTRAVENOUS

## 2019-05-17 MED ORDER — DIPHENHYDRAMINE HCL 12.5 MG/5ML PO ELIX
12.5000 mg | ORAL_SOLUTION | ORAL | Status: DC | PRN
  Filled 2019-05-17: qty 10

## 2019-05-17 MED ORDER — HYDROMORPHONE HCL 1 MG/ML IJ SOLN: 0.5000 mg | INTRAMUSCULAR | Status: DC | PRN

## 2019-05-17 MED ORDER — MIDAZOLAM HCL 5 MG/5ML IJ SOLN
INTRAMUSCULAR | Status: DC | PRN
  Administered 2019-05-17: 2 mg via INTRAVENOUS

## 2019-05-17 MED ORDER — OXYCODONE HCL 5 MG PO TABS: 10.0000 mg | ORAL_TABLET | ORAL | Status: DC | PRN

## 2019-05-17 MED ORDER — ROCURONIUM BROMIDE 10 MG/ML (PF) SYRINGE
PREFILLED_SYRINGE | INTRAVENOUS | Status: AC
  Filled 2019-05-17: qty 10

## 2019-05-17 MED ORDER — LOSARTAN POTASSIUM-HCTZ 100-12.5 MG PO TABS: 1.0000 | ORAL_TABLET | Freq: Every day | ORAL | Status: DC

## 2019-05-17 MED ORDER — STERILE WATER FOR IRRIGATION IR SOLN
Status: DC | PRN
  Administered 2019-05-17: 2000 mL

## 2019-05-17 MED ORDER — ZOLPIDEM TARTRATE 5 MG PO TABS: 5.0000 mg | ORAL_TABLET | Freq: Every evening | ORAL | Status: DC | PRN

## 2019-05-17 MED ORDER — POVIDONE-IODINE 10 % EX SWAB
2.0000 "application " | Freq: Once | CUTANEOUS | Status: AC
  Administered 2019-05-17: 2 via TOPICAL

## 2019-05-17 MED ORDER — ALUM & MAG HYDROXIDE-SIMETH 200-200-20 MG/5ML PO SUSP
30.0000 mL | ORAL | Status: DC | PRN
  Filled 2019-05-17: qty 30

## 2019-05-17 MED ORDER — ONDANSETRON HCL 4 MG/2ML IJ SOLN: 4.0000 mg | Freq: Four times a day (QID) | INTRAMUSCULAR | Status: DC | PRN

## 2019-05-17 MED ORDER — TRANEXAMIC ACID-NACL 1000-0.7 MG/100ML-% IV SOLN
1000.0000 mg | INTRAVENOUS | Status: AC
  Administered 2019-05-17: 1000 mg via INTRAVENOUS
  Filled 2019-05-17: qty 100

## 2019-05-17 MED ORDER — OXYCODONE HCL 5 MG PO TABS: 5.0000 mg | ORAL_TABLET | ORAL | 0 refills | Status: DC | PRN

## 2019-05-17 SURGICAL SUPPLY — 58 items
BIT DRILL 2.0X128 (BIT) ×2 IMPLANT
BLADE SAW SGTL 73X25 THK (BLADE) ×2 IMPLANT
CLSR STERI-STRIP ANTIMIC 1/2X4 (GAUZE/BANDAGES/DRESSINGS) ×4 IMPLANT
COVER SURGICAL LIGHT HANDLE (MISCELLANEOUS) ×2 IMPLANT
COVER WAND RF STERILE (DRAPES) IMPLANT
DRAPE INCISE IOBAN 66X45 STRL (DRAPES) ×2 IMPLANT
DRAPE ORTHO SPLIT 77X108 STRL (DRAPES) ×2
DRAPE POUCH INSTRU U-SHP 10X18 (DRAPES) ×2 IMPLANT
DRAPE SHEET LG 3/4 BI-LAMINATE (DRAPES) ×2 IMPLANT
DRAPE SURG 17X11 SM STRL (DRAPES) ×2 IMPLANT
DRAPE SURG ORHT 6 SPLT 77X108 (DRAPES) ×2 IMPLANT
DRAPE U-SHAPE 47X51 STRL (DRAPES) ×2 IMPLANT
DRSG MEPILEX BORDER 4X12 (GAUZE/BANDAGES/DRESSINGS) ×2 IMPLANT
DRSG MEPILEX BORDER 4X8 (GAUZE/BANDAGES/DRESSINGS) IMPLANT
DURAPREP 26ML APPLICATOR (WOUND CARE) ×4 IMPLANT
ELECT BLADE TIP CTD 4 INCH (ELECTRODE) ×2 IMPLANT
ELECT REM PT RETURN 15FT ADLT (MISCELLANEOUS) ×2 IMPLANT
ELIMINATOR HOLE APEX DEPUY (Hips) ×2 IMPLANT
FACESHIELD WRAPAROUND (MASK) ×2 IMPLANT
GLOVE BIO SURGEON STRL SZ7 (GLOVE) ×2 IMPLANT
GLOVE BIOGEL PI IND STRL 7.0 (GLOVE) ×1 IMPLANT
GLOVE BIOGEL PI IND STRL 8 (GLOVE) ×1 IMPLANT
GLOVE BIOGEL PI INDICATOR 7.0 (GLOVE) ×1
GLOVE BIOGEL PI INDICATOR 8 (GLOVE) ×1
GLOVE SURG SS PI 7.5 STRL IVOR (GLOVE) ×2 IMPLANT
GOWN STRL REUS W/TWL LRG LVL3 (GOWN DISPOSABLE) ×4 IMPLANT
HEAD M SROM 36MM PLUS 1.5 (Hips) ×1 IMPLANT
HOOD PEEL AWAY FLYTE STAYCOOL (MISCELLANEOUS) ×6 IMPLANT
KIT BASIN OR (CUSTOM PROCEDURE TRAY) ×2 IMPLANT
KIT TURNOVER KIT A (KITS) ×2 IMPLANT
LINER NEUTRAL 52X36MM PLUS 4 (Liner) ×2 IMPLANT
MANIFOLD NEPTUNE II (INSTRUMENTS) ×2 IMPLANT
NDL SAFETY ECLIPSE 18X1.5 (NEEDLE) ×2 IMPLANT
NEEDLE HYPO 18GX1.5 SHARP (NEEDLE) ×2
NS IRRIG 1000ML POUR BTL (IV SOLUTION) ×2 IMPLANT
PACK TOTAL JOINT (CUSTOM PROCEDURE TRAY) ×2 IMPLANT
PENCIL SMOKE EVACUATOR (MISCELLANEOUS) IMPLANT
PIN SECTOR W/GRIP ACE CUP 52MM (Hips) ×2 IMPLANT
PROTECTOR NERVE ULNAR (MISCELLANEOUS) ×2 IMPLANT
RETRIEVER SUT HEWSON (MISCELLANEOUS) ×2 IMPLANT
SCREW 6.5MMX25MM (Screw) ×2 IMPLANT
SROM M HEAD 36MM PLUS 1.5 (Hips) ×2 IMPLANT
STEM SUMMIT STD 4 (Stem) ×2 IMPLANT
STRIP CLOSURE SKIN 1/2X4 (GAUZE/BANDAGES/DRESSINGS) ×2 IMPLANT
SUCTION FRAZIER HANDLE 12FR (TUBING) ×1
SUCTION TUBE FRAZIER 12FR DISP (TUBING) ×1 IMPLANT
SUT FIBERWIRE #2 38 REV NDL BL (SUTURE) ×6
SUT VIC AB 0 CT1 36 (SUTURE) ×2 IMPLANT
SUT VIC AB 1 CT1 36 (SUTURE) ×4 IMPLANT
SUT VIC AB 2-0 CT1 27 (SUTURE) ×2
SUT VIC AB 2-0 CT1 TAPERPNT 27 (SUTURE) ×2 IMPLANT
SUT VIC AB 3-0 SH 8-18 (SUTURE) ×2 IMPLANT
SUTURE FIBERWR#2 38 REV NDL BL (SUTURE) ×3 IMPLANT
SYR CONTROL 10ML LL (SYRINGE) ×4 IMPLANT
TOWEL OR 17X26 10 PK STRL BLUE (TOWEL DISPOSABLE) ×2 IMPLANT
TRAY FOLEY MTR SLVR 16FR STAT (SET/KITS/TRAYS/PACK) ×2 IMPLANT
WATER STERILE IRR 1000ML POUR (IV SOLUTION) ×4 IMPLANT
YANKAUER SUCT BULB TIP 10FT TU (MISCELLANEOUS) ×2 IMPLANT

## 2019-05-17 NOTE — Evaluation (Addendum)
Physical Therapy Evaluation Patient Details Name: Wendy Ray MRN: [id number] DOB: March 02, 1951 Today's Date: 05/17/2019   History of Present Illness  Patient is 69 y.o. female s/p Lt THA posterior approach on 05/17/19 with PMH significant for HTN, Rt THA, and OA.  Clinical Impression  Wendy Ray is a 69 y.o. female POD 0 s/p Lt THA posterior approach. Patient reports independence with mobility at baseline. Patient is now limited by functional impairments (see PT problem list below) and requires supervision for transfers and gait with RW. Patient was able to ambulate ~100 feet with RW and supervision and cues for safe walker management. Patient instructed in exercises to facilitate ROM and circulation; she was also instructed in posterior hip precautions and verbalized understanding through teach back technique. Patient will benefit from continued skilled PT interventions to address impairments and progress towards PLOF. Patient has met mobility goals at adequate level for discharge home; will continue to follow if pt continues acute stay to progress towards Mod I goals.     Follow Up Recommendations Home health PT;Follow surgeon's recommendation for DC plan and follow-up therapies    Equipment Recommendations  None recommended by PT    Recommendations for Other Services       Precautions / Restrictions Precautions Precautions: Fall;Posterior Hip Precaution Booklet Issued: Yes (comment) Restrictions Weight Bearing Restrictions: No      Mobility  Bed Mobility Overal bed mobility: Needs Assistance Bed Mobility: Supine to Sit;Sit to Supine     Supine to sit: HOB elevated;Supervision Sit to supine: HOB elevated;Supervision   General bed mobility comments: verbal cues for use of gait belt to assist with Lt LE mobility to bring to EOB and to raise up into bed. no physical assist required, pt slow to mobilize.  Transfers Overall transfer level: Needs assistance Equipment used:  Rolling walker (2 wheeled) Transfers: Sit to/from Stand Sit to Stand: Supervision         General transfer comment: cues for safe Lt LE positioning and hand placement for technique with RW, no assist required for power up  Ambulation/Gait Ambulation/Gait assistance: Supervision Gait Distance (Feet): 100 Feet Assistive device: Rolling walker (2 wheeled) Gait Pattern/deviations: Step-to pattern;Decreased stride length;Decreased stance time - left;Decreased step length - right;Antalgic Gait velocity: decreased   General Gait Details: verbal cues required for safe step pattern within RW and to maintain safe proximity. no overt LOB noted.  Stairs Stairs: (pt does not have stairs)          Wheelchair Mobility    Modified Rankin (Stroke Patients Only)       Balance Overall balance assessment: Needs assistance Sitting-balance support: Feet supported Sitting balance-Leahy Scale: Good     Standing balance support: During functional activity;Bilateral upper extremity supported Standing balance-Leahy Scale: Fair                Pertinent Vitals/Pain Pain Assessment: 0-10 Pain Score: 1  Pain Location: Rt hip incision Pain Descriptors / Indicators: Burning Pain Intervention(s): Monitored during session    Home Living Family/patient expects to be discharged to:: Private residence Living Arrangements: Alone Available Help at Discharge: Family;Available 24 hours/day(pt's daughter and granddaughter will stay with her until the 25th) Type of Home: House Home Access: Ramped entrance Entrance Stairs-Rails: None Entrance Stairs-Number of Steps: built ramp for her husband Home Layout: One level Home Equipment: Environmental consultant - 2 wheels;Bedside commode;Tub bench;Grab bars - tub/shower;Grab bars - toilet(reacher)      Prior Function Level of Independence: Independent with assistive device(s)  Comments: pt using RW and sometimes wheelchair due to pain     Hand  Dominance   Dominant Hand: Right    Extremity/Trunk Assessment   Upper Extremity Assessment Upper Extremity Assessment: Overall WFL for tasks assessed    Lower Extremity Assessment Lower Extremity Assessment: LLE deficits/detail;Overall WFL for tasks assessed LLE Deficits / Details: pt with good quad activation and able to perform LAQ LLE Sensation: WNL LLE Coordination: WNL    Cervical / Trunk Assessment Cervical / Trunk Assessment: Normal  Communication   Communication: No difficulties  Cognition Arousal/Alertness: Awake/alert Behavior During Therapy: WFL for tasks assessed/performed Overall Cognitive Status: Within Functional Limits for tasks assessed               General Comments      Exercises Total Joint Exercises Ankle Circles/Pumps: AROM;20 reps;Supine;Both Quad Sets: AROM;5 reps;Supine;Left Short Arc Quad: AROM;5 reps;Supine;Left Heel Slides: AAROM;5 reps;Supine;Left Hip ABduction/ADduction: AROM;5 reps;Supine;Left Long Arc Quad: AROM;5 reps;Seated;Left Other Exercises Other Exercises: educated on standing exercises: marching, hip abduction, hip extension, knee flexion (educated to perform with UE support at counter)   Assessment/Plan    PT Assessment Patient needs continued PT services  PT Problem List Decreased strength;Decreased range of motion;Decreased balance;Decreased activity tolerance;Decreased mobility;Decreased knowledge of use of DME;Decreased knowledge of precautions       PT Treatment Interventions DME instruction;Functional mobility training;Balance training;Patient/family education;Therapeutic activities;Gait training;Stair training;Therapeutic exercise    PT Goals (Current goals can be found in the Care Plan section)  Acute Rehab PT Goals Patient Stated Goal: to return home tonight PT Goal Formulation: With patient Time For Goal Achievement: 05/24/19 Potential to Achieve Goals: Good    Frequency 7X/week    AM-PAC PT "6 Clicks"  Mobility  Outcome Measure Help needed turning from your back to your side while in a flat bed without using bedrails?: A Little Help needed moving from lying on your back to sitting on the side of a flat bed without using bedrails?: A Little Help needed moving to and from a bed to a chair (including a wheelchair)?: A Little Help needed standing up from a chair using your arms (e.g., wheelchair or bedside chair)?: A Little Help needed to walk in hospital room?: A Little Help needed climbing 3-5 steps with a railing? : A Little 6 Click Score: 18    End of Session Equipment Utilized During Treatment: Gait belt Activity Tolerance: Patient tolerated treatment well Patient left: with call bell/phone within reach;in bed Nurse Communication: Mobility status PT Visit Diagnosis: Muscle weakness (generalized) (M62.81);Difficulty in walking, not elsewhere classified (R26.2)    Time: 1705-1740 PT Time Calculation (min) (ACUTE ONLY): 35 min   Charges:   PT Evaluation $PT Eval Low Complexity: 1 Low PT Treatments $Therapeutic Exercise: 8-22 mins       Verner Mould, DPT Physical Therapist with [facility] [phone]  05/17/2019 6:01 PM

## 2019-05-17 NOTE — Op Note (Signed)
05/17/2019  4:27 PM  PATIENT:  Wendy Ray   MRN: [id number]  PRE-OPERATIVE DIAGNOSIS: Left hip avascular necrosis with collapse  POST-OPERATIVE DIAGNOSIS:  same  PROCEDURE:  Procedure(s): LEFT TOTAL HIP ARTHROPLASTY  PREOPERATIVE INDICATIONS:    Wendy Ray is an 69 y.o. female who has a diagnosis of left hip avascular necrosis with collapse and elected for surgical management after failing conservative treatment.  The risks benefits and alternatives were discussed with the patient including but not limited to the risks of nonoperative treatment, versus surgical intervention including infection, bleeding, nerve injury, periprosthetic fracture, the need for revision surgery, dislocation, leg length discrepancy, blood clots, cardiopulmonary complications, morbidity, mortality, among others, and they were willing to proceed.    She has coexisting morbid obesity with a BMI of 45, but elected to proceed with arthroplasty, given that she was basically wheelchair-bound with severe collapse of her femoral head.  OPERATIVE REPORT     SURGEON:  Marchia Bond, MD    ASSISTANT:  Merlene Pulling, PA-C, (Present throughout the entire procedure,  necessary for completion of procedure in a timely manner, assisting with retraction, instrumentation, and closure)     ANESTHESIA: Spinal  ESTIMATED BLOOD LOSS: 540 mL    COMPLICATIONS:  None.     UNIQUE ASPECTS OF THE CASE: Lateralized 3 times, attempting to get down and alignment of the femoral canal, and despite this based on her postoperative x-ray it appears that I still ended up in somewhat varus.  Nonetheless the 4 was fairly snug, in fact I could not even get fully seated.  The acetabulum had a fair amount of cystic changes, and the femoral head was completely collapsed.  Bone quality was mediocre on the acetabulum.  When I completely seated the cup, I basically lost the rims both anterior and posterior for reference.  Her morbid obesity made  access somewhat more challenging, I used a Deaver posteriorly throughout the case in order to maintain visualization care was taken not to place it directly on the sciatic nerve.  COMPONENTS:  [employer] fit femur size 4 with a 36 mm + 1.5 metallic head ball and a Gription Acetabular shell size 52, with a single cancellous screw for backup fixation, with an apex hole eliminator and a +4 neutral polyethylene liner.    PROCEDURE IN DETAIL:   The patient was met in the holding area and  identified.  The appropriate hip was identified and marked at the operative site.  The patient was then transported to the OR  and  placed under anesthesia.  At that point, the patient was  placed in the lateral decubitus position with the operative side up and  secured to the operating room table and all bony prominences padded.     The operative lower extremity was prepped from the iliac crest to the distal leg.  Sterile draping was performed.  Time out was performed prior to incision.      A routine posterolateral approach was utilized via sharp dissection  carried down to the subcutaneous tissue.  Gross bleeders were Bovie coagulated.  The iliotibial band was identified and incised along the length of the skin incision.  Self-retaining retractors were  inserted.  With the hip internally rotated, the short external rotators  were identified. The piriformis and capsule was tagged with FiberWire, and the hip capsule released in a T-type fashion.  The femoral neck was exposed, and I resected the femoral neck using the appropriate jig. This was  performed at approximately a thumb's breadth above the lesser trochanter.  The head was completely collapsed, and there were fragments of bone contained within the acetabulum.    I then exposed the deep acetabulum, cleared out any tissue including the ligamentum teres.  A wing retractor was placed.  After adequate visualization, I excised the labrum, and then  sequentially reamed.  I placed the trial acetabulum, which seated nicely, and then impacted the real cup into place.  Appropriate version and inclination was confirmed clinically matching their bony anatomy, and also with the use of the jig.  I placed a cancellous screw to augment fixation.  A trial polyethylene liner was placed and the wing retractor removed.    I then prepared the proximal femur using the cookie-cutter, the lateralizing reamer, and then sequentially reamed and broached.  A trial broach, neck, and head was utilized, and I reduced the hip and it was found to have excellent stability with functional range of motion. The trial components were then removed, and the real polyethylene liner was placed.  I then impacted the real femoral prosthesis into place into the appropriate version, slightly anteverted to the normal anatomy, and I impacted the real head ball into place. The hip was then reduced and taken through functional range of motion and found to have excellent stability. Leg lengths were restored.  I then used a 2 mm drill bits to pass the FiberWire suture from the capsule and piriformis through the greater trochanter, and secured this. Excellent posterior capsular repair was achieved. I also closed the T in the capsule.  I then irrigated the hip copiously again with pulse lavage, and repaired the fascia with Vicryl, followed by Vicryl for the subcutaneous tissue, Monocryl for the skin, Steri-Strips and sterile gauze. The wounds were injected. The patient was then awakened and returned to PACU in stable and satisfactory condition. There were no complications.  Marchia Bond, MD Orthopedic Surgeon [phone]   05/17/2019 4:27 PM

## 2019-05-17 NOTE — Anesthesia Preprocedure Evaluation (Addendum)
Anesthesia Evaluation  Patient identified by MRN, date of birth, ID band Patient awake    Reviewed: Allergy & Precautions, NPO status , Patient's Chart, lab work & pertinent test results  Airway Mallampati: II  TM Distance: >3 FB Neck ROM: Full    Dental  (+) Chipped, Poor Dentition,    Pulmonary  Snores - ? Undiagnosed OSA has not had sleep study   Pulmonary exam normal breath sounds clear to auscultation       Cardiovascular hypertension, Pt. on medications Normal cardiovascular exam Rhythm:Regular Rate:Normal     Neuro/Psych negative neurological ROS  negative psych ROS   GI/Hepatic negative GI ROS, Neg liver ROS,   Endo/Other  Morbid obesityHyperlipidemia  Renal/GU negative Renal ROS  negative genitourinary   Musculoskeletal  (+) Arthritis , Osteoarthritis,    Abdominal (+) + obese,   Peds  Hematology   Anesthesia Other Findings   Reproductive/Obstetrics                           Anesthesia Physical Anesthesia Plan  ASA: III  Anesthesia Plan: Spinal   Post-op Pain Management:    Induction:   PONV Risk Score and Plan: 3 and Ondansetron, Treatment may vary due to age or medical condition and Propofol infusion  Airway Management Planned: Natural Airway, Nasal Cannula and Simple Face Mask  Additional Equipment:   Intra-op Plan:   Post-operative Plan: Extubation in OR  Informed Consent: I have reviewed the patients History and Physical, chart, labs and discussed the procedure including the risks, benefits and alternatives for the proposed anesthesia with the patient or authorized representative who has indicated his/her understanding and acceptance.     Dental advisory given  Plan Discussed with: CRNA and Surgeon  Anesthesia Plan Comments:        Anesthesia Quick Evaluation

## 2019-05-17 NOTE — Discharge Summary (Signed)
Discharge Summary  Patient ID: Wendy Ray MRN: [id number] DOB/AGE: July 11, 1950 69 y.o.  Admit date: 05/17/2019 Discharge date: 05/17/2019  Admission Diagnoses:  Avascular necrosis of bone of hip, left [facility])  Discharge Diagnoses:  Principal Problem:   Avascular necrosis of bone of hip, left ([place]) Active Problems:   S/P hip replacement, left   Avascular necrosis of bone of left hip [facility])   Past Medical History:  Diagnosis Date  . Arthritis   . Hypertension   . Primary localized osteoarthritis of right hip 01/22/2016    Surgeries: Procedure(s): TOTAL HIP ARTHROPLASTY on 05/17/2019   Consultants (if any):   Discharged Condition: Improved  Hospital Course: Wendy Ray is an 69 y.o. female who was admitted 05/17/2019 with a diagnosis of Avascular necrosis of bone of hip, left ([place]) and went to the operating room on 05/17/2019 and underwent the above named procedures.    She was given perioperative antibiotics:  Anti-infectives (From admission, onward)   Start     Dose/Rate Route Frequency Ordered Stop   05/17/19 2000  ceFAZolin (ANCEF) IVPB 2g/100 mL premix     2 g 200 mL/hr over 30 Minutes Intravenous Every 6 hours 05/17/19 1541 05/18/19 0759   05/17/19 1621  ceFAZolin (ANCEF) 2-4 GM/100ML-% IVPB    Note to Pharmacy: Elisha Headland  : cabinet override      05/17/19 1621 05/18/19 0429   05/17/19 0830  ceFAZolin (ANCEF) IVPB 2g/100 mL premix     2 g 200 mL/hr over 30 Minutes Intravenous On call to O.R. 05/17/19 0815 05/17/19 1155    .  She was given sequential compression devices, early ambulation, and aspirin for DVT prophylaxis.  She benefited maximally from the hospital stay and there were no complications.    Recent vital signs:  Vitals:   05/17/19 1600 05/17/19 1630  BP: 139/74 131/70  Pulse: 64   Resp: (!) 21   Temp: 97.9 F (36.6 C) 97.8 F (36.6 C)  SpO2: 100% 100%    Recent laboratory studies:  Lab Results  Component Value Date   HGB 14.5  05/09/2019   HGB 9.6 (L) 01/23/2016   HGB 14.4 01/11/2016   Lab Results  Component Value Date   WBC 6.7 05/09/2019   PLT 207 05/09/2019   No results found for: INR Lab Results  Component Value Date   NA 141 05/09/2019   K 4.3 05/09/2019   CL 105 05/09/2019   CO2 27 05/09/2019   BUN 26 (H) 05/09/2019   CREATININE 1.07 (H) 05/09/2019   GLUCOSE 114 (H) 05/09/2019    Discharge Medications:   Allergies as of 05/17/2019   No Known Allergies     Medication List    STOP taking these medications   meloxicam 15 MG tablet Commonly known as: MOBIC   methocarbamol 500 MG tablet Commonly known as: ROBAXIN     TAKE these medications   amLODipine 5 MG tablet Commonly known as: NORVASC Take 5 mg by mouth daily.   aspirin EC 325 MG tablet Take 1 tablet (325 mg total) by mouth 2 (two) times daily.   baclofen 10 MG tablet Commonly known as: LIORESAL Take 1 tablet (10 mg total) by mouth 3 (three) times daily. As needed for muscle spasm   losartan-hydrochlorothiazide 100-12.5 MG tablet Commonly known as: HYZAAR Take 1 tablet by mouth daily.   ondansetron 4 MG tablet Commonly known as: Zofran Take 1 tablet (4 mg total) by mouth every 8 (eight) hours as needed for  nausea or vomiting.   oxyCODONE 5 MG immediate release tablet Commonly known as: Roxicodone Take 1 tablet (5 mg total) by mouth every 4 (four) hours as needed for severe pain.   pravastatin 20 MG tablet Commonly known as: PRAVACHOL Take 20 mg by mouth daily.   sennosides-docusate sodium 8.6-50 MG tablet Commonly known as: SENOKOT-S Take 2 tablets by mouth daily.       Diagnostic Studies: DG HIP PORT UNILAT WITH PELVIS 1V LEFT  Result Date: 05/17/2019 CLINICAL DATA:  New left total hip surgery. EXAM: DG HIP (WITH OR WITHOUT PELVIS) 1V PORT LEFT COMPARISON:  April 27, 2019 FINDINGS: Bilateral total hip replacements are seen without evidence of surrounding lucency to suggest the presence of hardware loosening  or infection. The left hip replacement represents a new finding when compared to the prior exam. There is no evidence of hip fracture or dislocation. Soft tissue structures are unremarkable. IMPRESSION: 1. Bilateral total hip replacements without evidence of hardware loosening or infection. 2. New finding of left hip replacement since the prior study. Electronically Signed   By: Wendy Ray M.D.   On: 05/17/2019 16:15    Disposition: Discharge disposition: 01-Home or Self Care         Follow-up Information    Wendy Bond, MD. Go on 06/01/2019.   Specialty: Orthopedic Surgery Why: You appointment has been scheduled for 4:30  Contact information: [address] [place] [address] [phone]        [facility] Follow up.   Specialty: [place] Why: You will be seen at home for HHPT until starting Outpatient physical therapy on 06/01/19  Contact information: [address] [place] [address] [phone]        [facility], [place]. Go on 06/01/2019.   Why: You are scheduled to start outpatient physical therapy at 3:00. Please arrive at 2:45 to complete your paperwork. You will go directly over to see Dr. Mardelle Matte after your therapy appointment.  Contact information: [facility] [place] [address] [phone]            Signed: Jola Ray 05/17/2019, 6:58 PM

## 2019-05-17 NOTE — H&P (Signed)
PREOPERATIVE H&P  Chief Complaint: Left hip avascular necrosis with collapse  HPI: Wendy Ray is a 69 y.o. female who presents for preoperative history and physical with a diagnosis of left hip avascular necrosis with collapse. Symptoms are rated as moderate to severe, and have been worsening.  This is significantly impairing activities of daily living.  She has elected for surgical management.   She has failed injections, activity modification, anti-inflammatories, and assistive devices.  Preoperative X-rays demonstrate end stage degenerative changes with osteophyte formation, loss of joint space, subchondral sclerosis. I did her other side in 2017 and she has done very well.  Past Medical History:  Diagnosis Date  . Arthritis   . Hypertension   . Primary localized osteoarthritis of right hip 01/22/2016   Past Surgical History:  Procedure Laterality Date  . BACK SURGERY  2003  . CERVICAL DISCECTOMY    . COLONOSCOPY    . JOINT REPLACEMENT    . TOTAL HIP ARTHROPLASTY Right 01/22/2016   Procedure: RIGHT TOTAL HIP ARTHROPLASTY;  Surgeon: Marchia Bond, MD;  Location: [place];  Service: Orthopedics;  Laterality: Right;   Social History   Socioeconomic History  . Marital status: Married    Spouse name: Not on file  . Number of children: Not on file  . Years of education: Not on file  . Highest education level: Not on file  Occupational History  . Not on file  Tobacco Use  . Smoking status: Never Smoker  . Smokeless tobacco: Never Used  Substance and Sexual Activity  . Alcohol use: Yes    Comment: occasionally  . Drug use: No  . Sexual activity: Not on file  Other Topics Concern  . Not on file  Social History Narrative  . Not on file   Social Determinants of Health   Financial Resource Strain:   . Difficulty of Paying Living Expenses: Not on file  Food Insecurity:   . Worried About Charity fundraiser in the Last Year: Not on file  . Ran Out of Food in the Last  Year: Not on file  Transportation Needs:   . Lack of Transportation (Medical): Not on file  . Lack of Transportation (Non-Medical): Not on file  Physical Activity:   . Days of Exercise per Week: Not on file  . Minutes of Exercise per Session: Not on file  Stress:   . Feeling of Stress : Not on file  Social Connections:   . Frequency of Communication with Friends and Family: Not on file  . Frequency of Social Gatherings with Friends and Family: Not on file  . Attends Religious Services: Not on file  . Active Member of Clubs or Organizations: Not on file  . Attends Archivist Meetings: Not on file  . Marital Status: Not on file   Family History  Problem Relation Age of Onset  . Colon cancer Mother   . Prostate cancer Father   . Colon polyps Sister   . Esophageal cancer Neg Hx   . Rectal cancer Neg Hx   . Stomach cancer Neg Hx    No Known Allergies Prior to Admission medications   Medication Sig Start Date End Date Taking? Authorizing Provider  amLODipine (NORVASC) 5 MG tablet Take 5 mg by mouth daily. 02/10/19  Yes [provider]  losartan-hydrochlorothiazide (HYZAAR) 100-12.5 MG tablet Take 1 tablet by mouth daily. 12/31/15  Yes [provider]  meloxicam (MOBIC) 15 MG tablet Take 15 mg by mouth daily  as needed for pain.  08/12/16  Yes [provider]  pravastatin (PRAVACHOL) 20 MG tablet Take 20 mg by mouth daily. 02/09/19  Yes [provider]  methocarbamol (ROBAXIN) 500 MG tablet Take 1 tablet (500 mg total) by mouth 3 (three) times daily. Patient not taking: Reported on 04/27/2019 01/22/16   Marchia Bond, MD     Positive ROS: All other systems have been reviewed and were otherwise negative with the exception of those mentioned in the HPI and as above.  Physical Exam:BP (!) 139/95 (BP Location: Right Arm)   Pulse (!) 101   Temp 98.2 F (36.8 C) (Oral)   Resp 16   Ht 5' 0.5" (1.537 m)   Wt 108.4 kg   SpO2 98%   BMI 45.91  kg/m   General: Alert, no acute distress Cardiovascular: No pedal edema Respiratory: No cyanosis, no use of accessory musculature GI: No organomegaly, abdomen is soft and non-tender Skin: No lesions in the area of chief complaint Neurologic: Sensation intact distally Psychiatric: Patient is competent for consent with normal mood and affect Lymphatic: No axillary or cervical lymphadenopathy  MUSCULOSKELETAL: Left hip is shortened, EHL and FHL intact, extreme pain with any arc of motion.  Assessment: Left hip avascular necrosis with collapse                                                                                      Morbid obesity  Plan: Plan for Procedure(s): TOTAL HIP ARTHROPLASTY  The risks benefits and alternatives were discussed with the patient including but not limited to the risks of nonoperative treatment, versus surgical intervention including infection, bleeding, nerve injury,  blood clots, cardiopulmonary complications, morbidity, mortality, among others, and they were willing to proceed.   Anticipated LOS equal to or greater than 2 midnights due to - Age 18 and older with one or more of the following:  - Obesity  - Expected need for hospital services (PT, OT, Nursing) required for safe  discharge  - Anticipated need for postoperative skilled nursing care or inpatient rehab  - Active co-morbidities: None    [name], MD Cell [phone]   05/17/2019 10:46 AM ]

## 2019-05-17 NOTE — Anesthesia Procedure Notes (Signed)
Spinal  Patient location during procedure: OR Start time: 05/17/2019 11:44 AM End time: 05/17/2019 11:47 AM Staffing Performed: anesthesiologist  Anesthesiologist: Josephine Igo, MD Preanesthetic Checklist Completed: patient identified, IV checked, site marked, risks and benefits discussed, surgical consent, monitors and equipment checked, pre-op evaluation and timeout performed Spinal Block Patient position: sitting Prep: DuraPrep and site prepped and draped Patient monitoring: heart rate, cardiac monitor, continuous pulse ox and blood pressure Approach: midline Location: L3-4 Injection technique: single-shot Needle Needle type: Pencan  Needle gauge: 24 G Needle length: 9 cm Needle insertion depth: 7 cm Assessment Sensory level: T4 Additional Notes Patient tolerated procedure well. Adequate sensory level.

## 2019-05-17 NOTE — Discharge Instructions (Signed)

## 2019-05-17 NOTE — Transfer of Care (Signed)
Immediate Anesthesia Transfer of Care Note  Patient: Wendy Ray  Procedure(s) Performed: TOTAL HIP ARTHROPLASTY (Left Hip)  Patient Location: PACU  Anesthesia Type:Spinal  Level of Consciousness: awake, alert  and oriented  Airway & Oxygen Therapy: Patient Spontanous Breathing and Patient connected to face mask oxygen  Post-op Assessment: Report given to RN and Post -op Vital signs reviewed and stable  Post vital signs: Reviewed and stable  Last Vitals:  Vitals Value Taken Time  BP    Temp    Pulse    Resp    SpO2      Last Pain:  Vitals:   05/17/19 0858  TempSrc:   PainSc: 5          Complications: No apparent anesthesia complications

## 2019-05-17 NOTE — Anesthesia Postprocedure Evaluation (Signed)
Anesthesia Post Note  Patient: Wendy Ray  Procedure(s) Performed: TOTAL HIP ARTHROPLASTY (Left Hip)     Patient location during evaluation: PACU Anesthesia Type: Spinal Level of consciousness: oriented and awake and alert Pain management: pain level controlled Vital Signs Assessment: post-procedure vital signs reviewed and stable Respiratory status: spontaneous breathing, respiratory function stable and nonlabored ventilation Cardiovascular status: blood pressure returned to baseline and stable Postop Assessment: no headache, no backache, no apparent nausea or vomiting and spinal receding Anesthetic complications: no    Last Vitals:  Vitals:   05/17/19 0812 05/17/19 1501  BP: (!) 139/95 123/78  Pulse: (!) 101 69  Resp: 16   Temp: 36.8 C 36.4 C  SpO2: 98%     Last Pain:  Vitals:   05/17/19 1501  TempSrc:   PainSc: 2                  Brizeida Mcmurry A.

## 2019-05-18 ENCOUNTER — Encounter: Payer: Self-pay | Admitting: *Deleted

## 2019-05-18 DIAGNOSIS — Z471 Aftercare following joint replacement surgery: Secondary | ICD-10-CM | POA: Diagnosis not present

## 2019-05-18 DIAGNOSIS — Z9889 Other specified postprocedural states: Secondary | ICD-10-CM | POA: Diagnosis not present

## 2019-05-18 DIAGNOSIS — Z96643 Presence of artificial hip joint, bilateral: Secondary | ICD-10-CM | POA: Diagnosis not present

## 2019-05-18 DIAGNOSIS — Z9181 History of falling: Secondary | ICD-10-CM | POA: Diagnosis not present

## 2019-05-18 DIAGNOSIS — Z6841 Body Mass Index (BMI) 40.0 and over, adult: Secondary | ICD-10-CM | POA: Diagnosis not present

## 2019-05-18 DIAGNOSIS — I10 Essential (primary) hypertension: Secondary | ICD-10-CM | POA: Diagnosis not present

## 2019-05-30 DIAGNOSIS — Z9181 History of falling: Secondary | ICD-10-CM | POA: Diagnosis not present

## 2019-05-30 DIAGNOSIS — Z6841 Body Mass Index (BMI) 40.0 and over, adult: Secondary | ICD-10-CM | POA: Diagnosis not present

## 2019-05-30 DIAGNOSIS — Z9889 Other specified postprocedural states: Secondary | ICD-10-CM | POA: Diagnosis not present

## 2019-05-30 DIAGNOSIS — Z471 Aftercare following joint replacement surgery: Secondary | ICD-10-CM | POA: Diagnosis not present

## 2019-05-30 DIAGNOSIS — Z96643 Presence of artificial hip joint, bilateral: Secondary | ICD-10-CM | POA: Diagnosis not present

## 2019-05-30 DIAGNOSIS — I10 Essential (primary) hypertension: Secondary | ICD-10-CM | POA: Diagnosis not present

## 2019-06-01 DIAGNOSIS — M6281 Muscle weakness (generalized): Secondary | ICD-10-CM | POA: Diagnosis not present

## 2019-06-01 DIAGNOSIS — M25552 Pain in left hip: Secondary | ICD-10-CM | POA: Diagnosis not present

## 2019-06-01 DIAGNOSIS — M25652 Stiffness of left hip, not elsewhere classified: Secondary | ICD-10-CM | POA: Diagnosis not present

## 2019-06-01 DIAGNOSIS — M1612 Unilateral primary osteoarthritis, left hip: Secondary | ICD-10-CM | POA: Diagnosis not present

## 2019-06-01 DIAGNOSIS — R262 Difficulty in walking, not elsewhere classified: Secondary | ICD-10-CM | POA: Diagnosis not present

## 2019-06-02 DIAGNOSIS — E7849 Other hyperlipidemia: Secondary | ICD-10-CM | POA: Diagnosis not present

## 2019-06-02 DIAGNOSIS — R739 Hyperglycemia, unspecified: Secondary | ICD-10-CM | POA: Diagnosis not present

## 2019-06-06 DIAGNOSIS — M25552 Pain in left hip: Secondary | ICD-10-CM | POA: Diagnosis not present

## 2019-06-06 DIAGNOSIS — R262 Difficulty in walking, not elsewhere classified: Secondary | ICD-10-CM | POA: Diagnosis not present

## 2019-06-06 DIAGNOSIS — M25652 Stiffness of left hip, not elsewhere classified: Secondary | ICD-10-CM | POA: Diagnosis not present

## 2019-06-06 DIAGNOSIS — M6281 Muscle weakness (generalized): Secondary | ICD-10-CM | POA: Diagnosis not present

## 2019-06-08 DIAGNOSIS — M25652 Stiffness of left hip, not elsewhere classified: Secondary | ICD-10-CM | POA: Diagnosis not present

## 2019-06-08 DIAGNOSIS — R262 Difficulty in walking, not elsewhere classified: Secondary | ICD-10-CM | POA: Diagnosis not present

## 2019-06-08 DIAGNOSIS — M25552 Pain in left hip: Secondary | ICD-10-CM | POA: Diagnosis not present

## 2019-06-08 DIAGNOSIS — M6281 Muscle weakness (generalized): Secondary | ICD-10-CM | POA: Diagnosis not present

## 2019-06-09 DIAGNOSIS — R739 Hyperglycemia, unspecified: Secondary | ICD-10-CM | POA: Diagnosis not present

## 2019-06-09 DIAGNOSIS — E785 Hyperlipidemia, unspecified: Secondary | ICD-10-CM | POA: Diagnosis not present

## 2019-06-09 DIAGNOSIS — Z0000 Encounter for general adult medical examination without abnormal findings: Secondary | ICD-10-CM | POA: Diagnosis not present

## 2019-06-09 DIAGNOSIS — Z1331 Encounter for screening for depression: Secondary | ICD-10-CM | POA: Diagnosis not present

## 2019-06-09 DIAGNOSIS — K219 Gastro-esophageal reflux disease without esophagitis: Secondary | ICD-10-CM | POA: Diagnosis not present

## 2019-06-09 DIAGNOSIS — Z1339 Encounter for screening examination for other mental health and behavioral disorders: Secondary | ICD-10-CM | POA: Diagnosis not present

## 2019-06-09 DIAGNOSIS — I10 Essential (primary) hypertension: Secondary | ICD-10-CM | POA: Diagnosis not present

## 2019-06-09 DIAGNOSIS — M5136 Other intervertebral disc degeneration, lumbar region: Secondary | ICD-10-CM | POA: Diagnosis not present

## 2019-06-13 DIAGNOSIS — M25662 Stiffness of left knee, not elsewhere classified: Secondary | ICD-10-CM | POA: Diagnosis not present

## 2019-06-13 DIAGNOSIS — M6281 Muscle weakness (generalized): Secondary | ICD-10-CM | POA: Diagnosis not present

## 2019-06-13 DIAGNOSIS — R262 Difficulty in walking, not elsewhere classified: Secondary | ICD-10-CM | POA: Diagnosis not present

## 2019-06-13 DIAGNOSIS — M25562 Pain in left knee: Secondary | ICD-10-CM | POA: Diagnosis not present

## 2019-06-14 DIAGNOSIS — D485 Neoplasm of uncertain behavior of skin: Secondary | ICD-10-CM | POA: Diagnosis not present

## 2019-06-14 DIAGNOSIS — C44529 Squamous cell carcinoma of skin of other part of trunk: Secondary | ICD-10-CM | POA: Diagnosis not present

## 2019-06-14 DIAGNOSIS — L821 Other seborrheic keratosis: Secondary | ICD-10-CM | POA: Diagnosis not present

## 2019-06-20 DIAGNOSIS — M6281 Muscle weakness (generalized): Secondary | ICD-10-CM | POA: Diagnosis not present

## 2019-06-20 DIAGNOSIS — M25652 Stiffness of left hip, not elsewhere classified: Secondary | ICD-10-CM | POA: Diagnosis not present

## 2019-06-20 DIAGNOSIS — M25552 Pain in left hip: Secondary | ICD-10-CM | POA: Diagnosis not present

## 2019-06-20 DIAGNOSIS — R262 Difficulty in walking, not elsewhere classified: Secondary | ICD-10-CM | POA: Diagnosis not present

## 2019-06-22 DIAGNOSIS — M6281 Muscle weakness (generalized): Secondary | ICD-10-CM | POA: Diagnosis not present

## 2019-06-22 DIAGNOSIS — M25652 Stiffness of left hip, not elsewhere classified: Secondary | ICD-10-CM | POA: Diagnosis not present

## 2019-06-22 DIAGNOSIS — R262 Difficulty in walking, not elsewhere classified: Secondary | ICD-10-CM | POA: Diagnosis not present

## 2019-06-22 DIAGNOSIS — M25552 Pain in left hip: Secondary | ICD-10-CM | POA: Diagnosis not present

## 2019-07-01 ENCOUNTER — Ambulatory Visit: Payer: PPO

## 2019-07-05 ENCOUNTER — Inpatient Hospital Stay (HOSPITAL_COMMUNITY)
Admission: EM | Admit: 2019-07-05 | Discharge: 2019-07-11 | DRG: 463 | Disposition: A | Discharge status: Home / Self Care | Payer: PPO | Attending: Family Medicine | Admitting: Family Medicine

## 2019-07-05 ENCOUNTER — Encounter (HOSPITAL_COMMUNITY): Payer: Self-pay | Admitting: Emergency Medicine

## 2019-07-05 ENCOUNTER — Other Ambulatory Visit: Payer: Self-pay

## 2019-07-05 ENCOUNTER — Emergency Department (HOSPITAL_COMMUNITY): Payer: PPO

## 2019-07-05 DIAGNOSIS — Z209 Contact with and (suspected) exposure to unspecified communicable disease: Secondary | ICD-10-CM | POA: Diagnosis not present

## 2019-07-05 DIAGNOSIS — L0390 Cellulitis, unspecified: Secondary | ICD-10-CM

## 2019-07-05 DIAGNOSIS — T8452XA Infection and inflammatory reaction due to internal left hip prosthesis, initial encounter: Principal | ICD-10-CM | POA: Diagnosis present

## 2019-07-05 DIAGNOSIS — L02416 Cutaneous abscess of left lower limb: Secondary | ICD-10-CM | POA: Diagnosis not present

## 2019-07-05 DIAGNOSIS — D649 Anemia, unspecified: Secondary | ICD-10-CM | POA: Diagnosis not present

## 2019-07-05 DIAGNOSIS — T465X5A Adverse effect of other antihypertensive drugs, initial encounter: Secondary | ICD-10-CM | POA: Diagnosis not present

## 2019-07-05 DIAGNOSIS — L03116 Cellulitis of left lower limb: Secondary | ICD-10-CM | POA: Diagnosis not present

## 2019-07-05 DIAGNOSIS — E876 Hypokalemia: Secondary | ICD-10-CM | POA: Diagnosis not present

## 2019-07-05 DIAGNOSIS — M25552 Pain in left hip: Secondary | ICD-10-CM | POA: Diagnosis not present

## 2019-07-05 DIAGNOSIS — Y831 Surgical operation with implant of artificial internal device as the cause of abnormal reaction of the patient, or of later complication, without mention of misadventure at the time of the procedure: Secondary | ICD-10-CM | POA: Diagnosis present

## 2019-07-05 DIAGNOSIS — Z6841 Body Mass Index (BMI) 40.0 and over, adult: Secondary | ICD-10-CM | POA: Diagnosis not present

## 2019-07-05 DIAGNOSIS — Z96643 Presence of artificial hip joint, bilateral: Secondary | ICD-10-CM | POA: Diagnosis not present

## 2019-07-05 DIAGNOSIS — Z79899 Other long term (current) drug therapy: Secondary | ICD-10-CM

## 2019-07-05 DIAGNOSIS — I10 Essential (primary) hypertension: Secondary | ICD-10-CM | POA: Diagnosis present

## 2019-07-05 DIAGNOSIS — Z09 Encounter for follow-up examination after completed treatment for conditions other than malignant neoplasm: Secondary | ICD-10-CM

## 2019-07-05 DIAGNOSIS — M1990 Unspecified osteoarthritis, unspecified site: Secondary | ICD-10-CM | POA: Diagnosis present

## 2019-07-05 DIAGNOSIS — R509 Fever, unspecified: Secondary | ICD-10-CM

## 2019-07-05 DIAGNOSIS — A419 Sepsis, unspecified organism: Secondary | ICD-10-CM | POA: Diagnosis not present

## 2019-07-05 DIAGNOSIS — D696 Thrombocytopenia, unspecified: Secondary | ICD-10-CM | POA: Diagnosis not present

## 2019-07-05 DIAGNOSIS — R52 Pain, unspecified: Secondary | ICD-10-CM | POA: Diagnosis not present

## 2019-07-05 DIAGNOSIS — N170 Acute kidney failure with tubular necrosis: Secondary | ICD-10-CM | POA: Diagnosis not present

## 2019-07-05 DIAGNOSIS — M25452 Effusion, left hip: Secondary | ICD-10-CM | POA: Diagnosis not present

## 2019-07-05 DIAGNOSIS — Z978 Presence of other specified devices: Secondary | ICD-10-CM | POA: Diagnosis not present

## 2019-07-05 DIAGNOSIS — G4489 Other headache syndrome: Secondary | ICD-10-CM | POA: Diagnosis not present

## 2019-07-05 DIAGNOSIS — D62 Acute posthemorrhagic anemia: Secondary | ICD-10-CM | POA: Diagnosis not present

## 2019-07-05 DIAGNOSIS — Z20822 Contact with and (suspected) exposure to covid-19: Secondary | ICD-10-CM | POA: Diagnosis present

## 2019-07-05 DIAGNOSIS — Z791 Long term (current) use of non-steroidal anti-inflammatories (NSAID): Secondary | ICD-10-CM

## 2019-07-05 DIAGNOSIS — E669 Obesity, unspecified: Secondary | ICD-10-CM | POA: Diagnosis present

## 2019-07-05 DIAGNOSIS — T888XXA Other specified complications of surgical and medical care, not elsewhere classified, initial encounter: Secondary | ICD-10-CM | POA: Diagnosis not present

## 2019-07-05 DIAGNOSIS — M00252 Other streptococcal arthritis, left hip: Secondary | ICD-10-CM | POA: Diagnosis not present

## 2019-07-05 DIAGNOSIS — Z95828 Presence of other vascular implants and grafts: Secondary | ICD-10-CM | POA: Diagnosis not present

## 2019-07-05 DIAGNOSIS — E785 Hyperlipidemia, unspecified: Secondary | ICD-10-CM | POA: Diagnosis not present

## 2019-07-05 DIAGNOSIS — T8149XA Infection following a procedure, other surgical site, initial encounter: Secondary | ICD-10-CM | POA: Diagnosis not present

## 2019-07-05 DIAGNOSIS — T39315A Adverse effect of propionic acid derivatives, initial encounter: Secondary | ICD-10-CM | POA: Diagnosis not present

## 2019-07-05 DIAGNOSIS — Z96642 Presence of left artificial hip joint: Secondary | ICD-10-CM | POA: Diagnosis not present

## 2019-07-05 DIAGNOSIS — K219 Gastro-esophageal reflux disease without esophagitis: Secondary | ICD-10-CM | POA: Diagnosis not present

## 2019-07-05 DIAGNOSIS — Z471 Aftercare following joint replacement surgery: Secondary | ICD-10-CM | POA: Diagnosis not present

## 2019-07-05 DIAGNOSIS — N179 Acute kidney failure, unspecified: Secondary | ICD-10-CM | POA: Diagnosis not present

## 2019-07-05 DIAGNOSIS — B950 Streptococcus, group A, as the cause of diseases classified elsewhere: Secondary | ICD-10-CM | POA: Diagnosis not present

## 2019-07-05 DIAGNOSIS — T502X5A Adverse effect of carbonic-anhydrase inhibitors, benzothiadiazides and other diuretics, initial encounter: Secondary | ICD-10-CM | POA: Diagnosis present

## 2019-07-05 LAB — CBC
HCT: 38.3 % (ref 36.0–46.0)
Hemoglobin: 12.3 g/dL (ref 12.0–15.0)
MCH: 31.5 pg (ref 26.0–34.0)
MCHC: 32.1 g/dL (ref 30.0–36.0)
MCV: 98 fL (ref 80.0–100.0)
Platelets: 153 10*3/uL (ref 150–400)
RBC: 3.91 MIL/uL (ref 3.87–5.11)
RDW: 13.6 % (ref 11.5–15.5)
WBC: 12.4 10*3/uL — ABNORMAL HIGH (ref 4.0–10.5)
nRBC: 0 % (ref 0.0–0.2)

## 2019-07-05 LAB — COMPREHENSIVE METABOLIC PANEL
ALT: 36 U/L (ref 0–44)
AST: 44 U/L — ABNORMAL HIGH (ref 15–41)
Albumin: 3.7 g/dL (ref 3.5–5.0)
Alkaline Phosphatase: 126 U/L (ref 38–126)
Anion gap: 14 (ref 5–15)
BUN: 29 mg/dL — ABNORMAL HIGH (ref 8–23)
CO2: 22 mmol/L (ref 22–32)
Calcium: 8.7 mg/dL — ABNORMAL LOW (ref 8.9–10.3)
Chloride: 105 mmol/L (ref 98–111)
Creatinine, Ser: 1.73 mg/dL — ABNORMAL HIGH (ref 0.44–1.00)
GFR calc Af Amer: 34 mL/min — ABNORMAL LOW (ref >60)
GFR calc non Af Amer: 30 mL/min — ABNORMAL LOW (ref >60)
Glucose, Bld: 128 mg/dL — ABNORMAL HIGH (ref 70–99)
Potassium: 3.2 mmol/L — ABNORMAL LOW (ref 3.5–5.1)
Sodium: 141 mmol/L (ref 135–145)
Total Bilirubin: 0.7 mg/dL (ref 0.3–1.2)
Total Protein: 6.9 g/dL (ref 6.5–8.1)

## 2019-07-05 LAB — RESPIRATORY PANEL BY RT PCR (FLU A&B, COVID)
Influenza A by PCR: NEGATIVE
Influenza B by PCR: NEGATIVE
SARS Coronavirus 2 by RT PCR: NEGATIVE

## 2019-07-05 LAB — C-REACTIVE PROTEIN: CRP: 18 mg/dL — ABNORMAL HIGH (ref <1.0)

## 2019-07-05 LAB — SEDIMENTATION RATE: Sed Rate: 50 mm/hr — ABNORMAL HIGH (ref 0–22)

## 2019-07-05 MED ORDER — VANCOMYCIN HCL 2000 MG/400ML IV SOLN
2000.0000 mg | Freq: Once | INTRAVENOUS | Status: AC
  Administered 2019-07-05: 2000 mg via INTRAVENOUS
  Filled 2019-07-05: qty 400

## 2019-07-05 MED ORDER — SODIUM CHLORIDE 0.9 % IV SOLN: INTRAVENOUS | Status: DC

## 2019-07-05 MED ORDER — AMLODIPINE BESYLATE 5 MG PO TABS
5.0000 mg | ORAL_TABLET | Freq: Every day | ORAL | Status: DC
  Administered 2019-07-06 – 2019-07-11 (×5): 5 mg via ORAL
  Filled 2019-07-05 (×6): qty 1

## 2019-07-05 MED ORDER — PANTOPRAZOLE SODIUM 20 MG PO TBEC
20.0000 mg | DELAYED_RELEASE_TABLET | Freq: Every day | ORAL | Status: DC | PRN
  Administered 2019-07-06 – 2019-07-07 (×2): 20 mg via ORAL
  Filled 2019-07-05 (×4): qty 1

## 2019-07-05 MED ORDER — POTASSIUM CHLORIDE 20 MEQ PO PACK
40.0000 meq | PACK | Freq: Once | ORAL | Status: AC
  Administered 2019-07-05: 40 meq via ORAL
  Filled 2019-07-05: qty 2

## 2019-07-05 MED ORDER — HEPARIN SODIUM (PORCINE) 5000 UNIT/ML IJ SOLN
5000.0000 [IU] | Freq: Two times a day (BID) | INTRAMUSCULAR | Status: DC
  Administered 2019-07-06 (×3): 5000 [IU] via SUBCUTANEOUS
  Filled 2019-07-05 (×3): qty 1

## 2019-07-05 MED ORDER — ONDANSETRON HCL 4 MG PO TABS: 4.0000 mg | ORAL_TABLET | Freq: Three times a day (TID) | ORAL | Status: DC | PRN

## 2019-07-05 MED ORDER — IOHEXOL 300 MG/ML  SOLN
80.0000 mL | Freq: Once | INTRAMUSCULAR | Status: AC | PRN
  Administered 2019-07-05: 20:00:00 80 mL via INTRAVENOUS

## 2019-07-05 MED ORDER — SODIUM CHLORIDE 0.9 % IV BOLUS
1000.0000 mL | Freq: Once | INTRAVENOUS | Status: AC
  Administered 2019-07-05: 20:00:00 1000 mL via INTRAVENOUS

## 2019-07-05 MED ORDER — ACETAMINOPHEN 500 MG PO TABS
500.0000 mg | ORAL_TABLET | Freq: Four times a day (QID) | ORAL | Status: DC | PRN
  Administered 2019-07-06 (×3): 500 mg via ORAL
  Filled 2019-07-05 (×3): qty 1

## 2019-07-05 MED ORDER — PRAVASTATIN SODIUM 20 MG PO TABS
20.0000 mg | ORAL_TABLET | Freq: Every day | ORAL | Status: DC
  Administered 2019-07-06 – 2019-07-11 (×5): 20 mg via ORAL
  Filled 2019-07-05 (×6): qty 1

## 2019-07-05 MED ORDER — SODIUM CHLORIDE (PF) 0.9 % IJ SOLN
INTRAMUSCULAR | Status: AC
  Filled 2019-07-05: qty 50

## 2019-07-05 MED ORDER — OXYCODONE HCL 5 MG PO TABS
5.0000 mg | ORAL_TABLET | ORAL | Status: DC | PRN
  Administered 2019-07-06 – 2019-07-10 (×5): 5 mg via ORAL
  Filled 2019-07-05 (×5): qty 1

## 2019-07-05 NOTE — ED Triage Notes (Signed)
Patient arriving by EMS c/o body aches since Sunday, and fever/chills since yesterday. Received 2nd COVID vaccine on the 12th. Patient reported redness on L hip, had hip replacement in January. States she is also having n/v since this morning. States temp was 100.2 at home  BP 120/70 P 120 96% RA

## 2019-07-05 NOTE — ED Provider Notes (Signed)
[facility] Emergency Department Provider Note MRN:  [id number]  Arrival date & time: 07/05/19     Chief Complaint   Fever   History of Present Illness   Wendy Ray is a 69 y.o. year-old female with a history of hypertension presenting to the ED with chief complaint of fever.  Fever, chills, body aches for the past 1 or 2 days.  Rash and tenderness noted to the left hip.  Hip replacement on that side about 6 weeks ago.  Denies cough, no cold-like symptoms, no abdominal pain, no other complaints.  Symptoms mild, constant.  Review of Systems  A complete 10 system review of systems was obtained and all systems are negative except as noted in the HPI and PMH.   Patient's Health History    Past Medical History:  Diagnosis Date  . Arthritis   . Hypertension   . Primary localized osteoarthritis of right hip 01/22/2016    Past Surgical History:  Procedure Laterality Date  . BACK SURGERY  2003  . CERVICAL DISCECTOMY    . COLONOSCOPY    . JOINT REPLACEMENT    . TOTAL HIP ARTHROPLASTY Right 01/22/2016   Procedure: RIGHT TOTAL HIP ARTHROPLASTY;  Surgeon: Marchia Bond, MD;  Location: [place];  Service: Orthopedics;  Laterality: Right;  . TOTAL HIP ARTHROPLASTY Left 05/17/2019   Procedure: TOTAL HIP ARTHROPLASTY;  Surgeon: Marchia Bond, MD;  Location: [facility];  Service: Orthopedics;  Laterality: Left;    Family History  Problem Relation Age of Onset  . Colon cancer Mother   . Prostate cancer Father   . Colon polyps Sister   . Esophageal cancer Neg Hx   . Rectal cancer Neg Hx   . Stomach cancer Neg Hx     Social History   Socioeconomic History  . Marital status: Married    Spouse name: Not on file  . Number of children: Not on file  . Years of education: Not on file  . Highest education level: Not on file  Occupational History  . Not on file  Tobacco Use  . Smoking status: Never Smoker  . Smokeless tobacco: Never Used  Substance and Sexual  Activity  . Alcohol use: Yes    Comment: occasionally  . Drug use: No  . Sexual activity: Not on file  Other Topics Concern  . Not on file  Social History Narrative  . Not on file   Social Determinants of Health   Financial Resource Strain:   . Difficulty of Paying Living Expenses: Not on file  Food Insecurity:   . Worried About Charity fundraiser in the Last Year: Not on file  . Ran Out of Food in the Last Year: Not on file  Transportation Needs:   . Lack of Transportation (Medical): Not on file  . Lack of Transportation (Non-Medical): Not on file  Physical Activity:   . Days of Exercise per Week: Not on file  . Minutes of Exercise per Session: Not on file  Stress:   . Feeling of Stress : Not on file  Social Connections:   . Frequency of Communication with Friends and Family: Not on file  . Frequency of Social Gatherings with Friends and Family: Not on file  . Attends Religious Services: Not on file  . Active Member of Clubs or Organizations: Not on file  . Attends Archivist Meetings: Not on file  . Marital Status: Not on file  Intimate Partner Violence:   .  Fear of Current or Ex-Partner: Not on file  . Emotionally Abused: Not on file  . Physically Abused: Not on file  . Sexually Abused: Not on file     Physical Exam   Vitals:   07/05/19 2030 07/05/19 2100  BP: (!) 131/59 (!) 141/79  Pulse: (!) 108 (!) 108  Resp: 13 14  Temp:    SpO2: 94% 95%    CONSTITUTIONAL: Well-appearing, NAD NEURO:  Alert and oriented x 3, no focal deficits EYES:  eyes equal and reactive ENT/NECK:  no LAD, no JVD CARDIO: Regular rate, well-perfused, normal S1 and S2 PULM:  CTAB no wheezing or rhonchi GI/GU:  normal bowel sounds, non-distended, non-tender MSK/SPINE:  No gross deformities, no edema SKIN: Erythema and induration to the left hip PSYCH:  Appropriate speech and behavior  *Additional and/or pertinent findings included in MDM below  Diagnostic and  Interventional Summary    EKG Interpretation  Date/Time:    Ventricular Rate:    PR Interval:    QRS Duration:   QT Interval:    QTC Calculation:   R Axis:     Text Interpretation:        Cardiac Monitoring Interpretation:  Labs Reviewed  CBC - Abnormal; Notable for the following components:      Result Value   WBC 12.4 (*)    All other components within normal limits  COMPREHENSIVE METABOLIC PANEL - Abnormal; Notable for the following components:   Potassium 3.2 (*)    Glucose, Bld 128 (*)    BUN 29 (*)    Creatinine, Ser 1.73 (*)    Calcium 8.7 (*)    AST 44 (*)    GFR calc non Af Amer 30 (*)    GFR calc Af Amer 34 (*)    All other components within normal limits  SEDIMENTATION RATE - Abnormal; Notable for the following components:   Sed Rate 50 (*)    All other components within normal limits  C-REACTIVE PROTEIN - Abnormal; Notable for the following components:   CRP 18.0 (*)    All other components within normal limits  CULTURE, BLOOD (SINGLE)  RESPIRATORY PANEL BY RT PCR (FLU A&B, COVID)    CT HIP LEFT W CONTRAST  Final Result      Medications  sodium chloride (PF) 0.9 % injection (has no administration in time range)  iohexol (OMNIPAQUE) 300 MG/ML solution 80 mL (80 mLs Intravenous Contrast Given 07/05/19 1942)  sodium chloride 0.9 % bolus 1,000 mL (1,000 mLs Intravenous New Bag/Given 07/05/19 2012)     Procedures  /  Critical Care Procedures  ED Course and Medical Decision Making  I have reviewed the triage vital signs, the nursing notes, and pertinent available records from the EMR.  Pertinent labs & imaging results that were available during my care of the patient were reviewed by me and considered in my medical decision making (see below for details).     Question of cellulitis versus deeper space or postoperative infection in this 69 year old female with recent left hip replacement.  She has preserved range of motion, decreasing the likelihood of a  joint infection, however will CT to exclude.  CT reveals fluid collection in the soft tissue adjacent to the replaced hip, discussed with Dr. Ronnie Derby of sports medicine orthopedics, will admit to hospital service and Dr. Mardelle Matte will see in the morning.  Barth Kirks. Sedonia Small, MD [place] [email or web address]  Final Clinical Impressions(s) / ED Diagnoses  ICD-10-CM   1. Cellulitis, unspecified cellulitis site  L03.90   2. Fever, unspecified fever cause  R50.9   3. Fluid collection at surgical site, initial encounter  [email or web address]     ED Discharge Orders    None       Discharge Instructions Discussed with and Provided to Patient:   Discharge Instructions   None       Maudie Flakes, MD 07/05/19 2141

## 2019-07-05 NOTE — H&P (Signed)
History and Physical    Wendy Ray [id number] DOB: 11-25-1950 DOA: 07/05/2019  PCP: Leanna Battles, MD  Patient coming from: Home  I have personally briefly reviewed patient's old medical records in [place]  Chief Complaint: Fevers/L Hip pain  HPI: Wendy Ray is a 69 y.o. female with medical history significant of HTN, HLD, GERD and underwent L THA 1/5 presents now with fevers, L. Hip pain.  Patient reports she was at usual baseline state of health until Sunday when she first began to develop severe headache followed by fevers and body aches on Monday.  She states she just felt very fatigued but did not notice any L. Hip pain at that time.  She reports this morning she woke up and generally felt much better but then around 4 pm began having pretty severe left hip pain and nausea, accompanied again by fevers and aches.  She reports she looked at her L. Hip and noticed redness and swelling which she did not bother to look at prior because she had not had any symptoms.  She thought initially maybe related to COVID vaccine (but > 10 days since dose).  She denies any cough, sob, chest pain.  No dysuria, diarrhea.  She denies any tobacco, alcohol or drug use   Review of Systems: As per HPI otherwise 10 point review of systems negative.    Past Medical History:  Diagnosis Date  . Arthritis   . Hypertension   . Primary localized osteoarthritis of right hip 01/22/2016    Past Surgical History:  Procedure Laterality Date  . BACK SURGERY  2003  . CERVICAL DISCECTOMY    . COLONOSCOPY    . JOINT REPLACEMENT    . TOTAL HIP ARTHROPLASTY Right 01/22/2016   Procedure: RIGHT TOTAL HIP ARTHROPLASTY;  Surgeon: Marchia Bond, MD;  Location: [place];  Service: Orthopedics;  Laterality: Right;  . TOTAL HIP ARTHROPLASTY Left 05/17/2019   Procedure: TOTAL HIP ARTHROPLASTY;  Surgeon: Marchia Bond, MD;  Location: [facility];  Service: Orthopedics;  Laterality: Left;     reports that  she has never smoked. She has never used smokeless tobacco. She reports current alcohol use. She reports that she does not use drugs.  No Known Allergies  Family History  Problem Relation Age of Onset  . Colon cancer Mother   . Prostate cancer Father   . Colon polyps Sister   . Esophageal cancer Neg Hx   . Rectal cancer Neg Hx   . Stomach cancer Neg Hx      Prior to Admission medications   Medication Sig Start Date End Date Taking? Authorizing Provider  acetaminophen (TYLENOL) 500 MG tablet Take 500 mg by mouth every 6 (six) hours as needed for headache.   Yes [provider]  amLODipine (NORVASC) 5 MG tablet Take 5 mg by mouth daily. 02/10/19  Yes [provider]  ibuprofen (ADVIL) 200 MG tablet Take 200 mg by mouth every 6 (six) hours as needed for headache.   Yes [provider]  losartan-hydrochlorothiazide (HYZAAR) 100-12.5 MG tablet Take 1 tablet by mouth daily. 12/31/15  Yes [provider]  Multiple Vitamins-Minerals (ICAPS AREDS 2 PO) Take 2 tablets by mouth daily.   Yes [provider]  pantoprazole (PROTONIX) 20 MG tablet Take 20 mg by mouth daily as needed for heartburn.  07/01/19  Yes [provider]  pravastatin (PRAVACHOL) 20 MG tablet Take 20 mg by mouth daily. 02/09/19  Yes [provider]  aspirin EC 325 MG tablet Take 1 tablet (325 mg total) by mouth 2 (two) times daily. Patient not taking: Reported on 07/05/2019 05/17/19   Ventura Bruns, PA-C  baclofen (LIORESAL) 10 MG tablet Take 1 tablet (10 mg total) by mouth 3 (three) times daily. As needed for muscle spasm Patient not taking: Reported on 07/05/2019 05/17/19   Ventura Bruns, PA-C  meloxicam (MOBIC) 15 MG tablet Take 15 mg by mouth daily as needed for pain.  06/06/19   [provider]  ondansetron (ZOFRAN) 4 MG tablet Take 1 tablet (4 mg total) by mouth every 8 (eight) hours as needed for nausea or vomiting. Patient not taking: Reported on 07/05/2019  05/17/19   Ventura Bruns, PA-C  oxyCODONE (ROXICODONE) 5 MG immediate release tablet Take 1 tablet (5 mg total) by mouth every 4 (four) hours as needed for severe pain. Patient not taking: Reported on 07/05/2019 05/17/19   Merlene Pulling K, PA-C  sennosides-docusate sodium (SENOKOT-S) 8.6-50 MG tablet Take 2 tablets by mouth daily. Patient not taking: Reported on 07/05/2019 05/17/19   Ventura Bruns, PA-C    Physical Exam: Vitals:   07/05/19 1808 07/05/19 2000 07/05/19 2030 07/05/19 2100  BP: 138/76 139/68 (!) 131/59 (!) 141/79  Pulse: (!) 110 (!) 110 (!) 108 (!) 108  Resp: 18 14 13 14   Temp: 99.8 F (37.7 C)     TempSrc: Oral     SpO2: 100% 92% 94% 95%  Weight:      Height:        Vitals:   07/05/19 1808 07/05/19 2000 07/05/19 2030 07/05/19 2100  BP: 138/76 139/68 (!) 131/59 (!) 141/79  Pulse: (!) 110 (!) 110 (!) 108 (!) 108  Resp: 18 14 13 14   Temp: 99.8 F (37.7 C)     TempSrc: Oral     SpO2: 100% 92% 94% 95%  Weight:      Height:        Constitutional: NAD, calm, comfortable Eyes: PERRL, lids and conjunctivae normal ENMT: Mucous membranes are moist. Posterior pharynx clear of any exudate or lesions.Normal dentition.  Neck: normal, supple, no masses, no thyromegaly Respiratory: clear to auscultation bilaterally, no wheezing, no crackles. Normal respiratory effort. No accessory muscle use.  Cardiovascular: Regular rate and rhythm, no murmurs / rubs / gallops. No extremity edema. 2+ pedal pulses. No carotid bruits.  Abdomen: no tenderness, no masses palpated. No hepatosplenomegaly. Bowel sounds positive.  Musculoskeletal: no clubbing / cyanosis. No joint deformity upper and lower extremities. Good ROM, no contractures. Normal muscle tone.  Skin: no rashes, lesions, ulcers. No induration.  Surgical scars to R and L hip noted.  L hip with erythema, not fully confluent but notable with area of induration underlying well healed scar.  Warm to touch. Neurologic: CN 2-12 grossly  intact. Sensation and Sensation grossly intact. Psychiatric: Normal judgment and insight. Alert and oriented x 3. Normal mood.    Labs on Admission: I have personally reviewed following labs and imaging studies  CBC: Recent Labs  Lab 07/05/19 1828  WBC 12.4*  HGB 12.3  HCT 38.3  MCV 98.0  PLT 0000000   Basic Metabolic Panel: Recent Labs  Lab 07/05/19 1828  NA 141  K 3.2*  CL 105  CO2 22  GLUCOSE 128*  BUN 29*  CREATININE 1.73*  CALCIUM 8.7*   GFR: Estimated Creatinine Clearance: 37.8 mL/min (A) (by C-G formula based on SCr of 1.73 mg/dL (H)). Liver Function Tests: Recent Labs  Lab  07/05/19 1828  AST 44*  ALT 36  ALKPHOS 126  BILITOT 0.7  PROT 6.9  ALBUMIN 3.7   No results for input(s): LIPASE, AMYLASE in the last 168 hours. No results for input(s): AMMONIA in the last 168 hours. Coagulation Profile: No results for input(s): INR, PROTIME in the last 168 hours. Cardiac Enzymes: No results for input(s): CKTOTAL, CKMB, CKMBINDEX, TROPONINI in the last 168 hours. BNP (last 3 results) No results for input(s): PROBNP in the last 8760 hours. HbA1C: No results for input(s): HGBA1C in the last 72 hours. CBG: No results for input(s): GLUCAP in the last 168 hours. Lipid Profile: No results for input(s): CHOL, HDL, LDLCALC, TRIG, CHOLHDL, LDLDIRECT in the last 72 hours. Thyroid Function Tests: No results for input(s): TSH, T4TOTAL, FREET4, T3FREE, THYROIDAB in the last 72 hours. Anemia Panel: No results for input(s): VITAMINB12, FOLATE, FERRITIN, TIBC, IRON, RETICCTPCT in the last 72 hours. Urine analysis: No results found for: COLORURINE, APPEARANCEUR, [place], [place], GLUCOSEU, HGBUR, BILIRUBINUR, KETONESUR, PROTEINUR, UROBILINOGEN, NITRITE, LEUKOCYTESUR  Radiological Exams on Admission: CT HIP LEFT W CONTRAST  Result Date: 07/05/2019 CLINICAL DATA:  Fever. Rash. Recent left total hip replacement on 05/17/2019. EXAM: CT OF THE LOWER LEFT EXTREMITY WITH CONTRAST  TECHNIQUE: Multidetector CT imaging of the lower left extremity was performed according to the standard protocol following intravenous contrast administration. COMPARISON:  Radiographs dated 05/17/2019 CONTRAST:  19mL OMNIPAQUE IOHEXOL 300 MG/ML  SOLN FINDINGS: Bones/Joint/Cartilage The components of the left total hip prosthesis appear in excellent position with no evidence of loosening or fracture. Subcortical cysts in the superior aspect of the left acetabulum are not felt to be significant. The visualized pelvic bones are otherwise normal. No appreciable left hip effusion. Muscles and Tendons Normal. Soft tissues There in the long gated rim enhancing fluid collection in the subcutaneous fat at the lateral aspect of the hip at the site of prior incision. This measures 15.5 x 4.0 x 2.2 cm. There is soft tissue stranding in the in the adjacent subcutaneous fat. This fluid collection extends to the muscle fascia at the lateral aspect of the hip. Some detail is obscured by the metallic artifact caused by the prosthesis. IMPRESSION: Abscess or postoperative seroma in the subcutaneous fat lateral to the left hip along the surgical incision track. Electronically Signed   By: Lorriane Shire M.D.   On: 07/05/2019 20:08    Assessment/Plan MARGRETTE ZARZECKI is a 69 y.o. female with medical history significant of HTN, HLD, GERD and underwent L THA 1/5 presents now with fevers, L. Hip pain and sepsis 2/2 cellulitis and abscess.  # Sepsis 2/2 L. Hip cellulitis and abscess - by imaging, appears to be more superficial, still able to ambulate/ROM intact so suspicion of joint infection is lower.   - CT Hip shows "rim enhancing fluid collection in the subcutaneous fat at the lateral aspect of the hip at the site of prior incision.  This measures 15.5 x 4.0 x 2.2 cm.  There is soft tissue stranding in the adjacent subcutaneous fat.  This fluid collection extends to the muscle fascia at the lateral aspect of the hip. Some  detail is obscured by the metallic artifact caused by the prosthesis." - blood cultures ordered - continue Vancomycin - Orthopedic Surgery consulted will eval in AM  # AKI - pre-renal vs ATN, suspect from infection pre-renal but also on ibuprofen and meloxicam which could be contributing - avoid NSAIDs - monitor I/O - IVF  # HTN - continue amlodipine  #  GERD - continue pantoprazole  # HLD - continue pravastatin  DVT prophylaxis: SQH Code Status: Full Consults called: Orthopedic Surgery, Dr. Mardelle Matte Admission status: inpatient   Truddie Hidden MD [facility] Pager [phone]  If 7PM-7AM, please contact night-coverage [email or web address] Password [facility]  07/05/2019, 9:51 PM

## 2019-07-05 NOTE — Progress Notes (Signed)
A consult was received from an ED physician for vancomycin per pharmacy dosing.  The patient's profile has been reviewed for ht/wt/allergies/indication/available labs.   A one time order has been placed for Vancomycin 2gm iv x1.  Further antibiotics/pharmacy consults should be ordered by admitting physician if indicated.                       Thank you, [name] 07/05/2019  9:45 PM

## 2019-07-06 ENCOUNTER — Inpatient Hospital Stay (HOSPITAL_COMMUNITY): Payer: PPO

## 2019-07-06 DIAGNOSIS — L0390 Cellulitis, unspecified: Secondary | ICD-10-CM

## 2019-07-06 LAB — BASIC METABOLIC PANEL
Anion gap: 13 (ref 5–15)
BUN: 29 mg/dL — ABNORMAL HIGH (ref 8–23)
CO2: 21 mmol/L — ABNORMAL LOW (ref 22–32)
Calcium: 8.2 mg/dL — ABNORMAL LOW (ref 8.9–10.3)
Chloride: 107 mmol/L (ref 98–111)
Creatinine, Ser: 1.83 mg/dL — ABNORMAL HIGH (ref 0.44–1.00)
GFR calc Af Amer: 32 mL/min — ABNORMAL LOW (ref >60)
GFR calc non Af Amer: 28 mL/min — ABNORMAL LOW (ref >60)
Glucose, Bld: 115 mg/dL — ABNORMAL HIGH (ref 70–99)
Potassium: 3.6 mmol/L (ref 3.5–5.1)
Sodium: 141 mmol/L (ref 135–145)

## 2019-07-06 LAB — CBC
HCT: 34.1 % — ABNORMAL LOW (ref 36.0–46.0)
Hemoglobin: 10.8 g/dL — ABNORMAL LOW (ref 12.0–15.0)
MCH: 31.5 pg (ref 26.0–34.0)
MCHC: 31.7 g/dL (ref 30.0–36.0)
MCV: 99.4 fL (ref 80.0–100.0)
Platelets: 138 10*3/uL — ABNORMAL LOW (ref 150–400)
RBC: 3.43 MIL/uL — ABNORMAL LOW (ref 3.87–5.11)
RDW: 13.8 % (ref 11.5–15.5)
WBC: 14.2 10*3/uL — ABNORMAL HIGH (ref 4.0–10.5)
nRBC: 0 % (ref 0.0–0.2)

## 2019-07-06 LAB — SURGICAL PCR SCREEN
MRSA, PCR: NEGATIVE
Staphylococcus aureus: POSITIVE — AB

## 2019-07-06 LAB — HIV ANTIBODY (ROUTINE TESTING W REFLEX): HIV Screen 4th Generation wRfx: NONREACTIVE

## 2019-07-06 MED ORDER — CHLORHEXIDINE GLUCONATE 4 % EX LIQD
60.0000 mL | Freq: Once | CUTANEOUS | Status: AC
  Administered 2019-07-06: 4 via TOPICAL

## 2019-07-06 MED ORDER — POVIDONE-IODINE 10 % EX SWAB
2.0000 "application " | Freq: Once | CUTANEOUS | Status: AC
  Administered 2019-07-07: 2 via TOPICAL

## 2019-07-06 MED ORDER — IOHEXOL 300 MG/ML  SOLN
50.0000 mL | Freq: Once | INTRAMUSCULAR | Status: AC | PRN
  Administered 2019-07-06: 5 mL via INTRA_ARTICULAR

## 2019-07-06 MED ORDER — ACETAMINOPHEN 500 MG PO TABS: 1000.0000 mg | ORAL_TABLET | Freq: Once | ORAL | Status: DC

## 2019-07-06 MED ORDER — LIDOCAINE HCL 1 % IJ SOLN
INTRAMUSCULAR | Status: AC
  Administered 2019-07-06: 5 mL via INTRA_ARTICULAR
  Filled 2019-07-06: qty 20

## 2019-07-06 MED ORDER — IBUPROFEN 400 MG PO TABS: 400.0000 mg | ORAL_TABLET | Freq: Four times a day (QID) | ORAL | Status: DC | PRN

## 2019-07-06 MED ORDER — ENSURE PRE-SURGERY PO LIQD
296.0000 mL | Freq: Once | ORAL | Status: DC
  Filled 2019-07-06: qty 296

## 2019-07-06 MED ORDER — SODIUM CHLORIDE 0.9 % IV SOLN
2.0000 g | INTRAVENOUS | Status: DC
  Administered 2019-07-06 – 2019-07-11 (×5): 2 g via INTRAVENOUS
  Filled 2019-07-06 (×2): qty 2
  Filled 2019-07-06 (×3): qty 20
  Filled 2019-07-06 (×2): qty 2

## 2019-07-06 MED ORDER — VANCOMYCIN HCL 750 MG/150ML IV SOLN: 750.0000 mg | INTRAVENOUS | Status: DC

## 2019-07-06 NOTE — Progress Notes (Signed)
PHARMACY - PHYSICIAN COMMUNICATION CRITICAL VALUE ALERT - BLOOD CULTURE IDENTIFICATION (BCID)  Wendy Ray is an 69 y.o. female who presented to [facility] on 07/05/2019 with a chief complaint of Left hip pain and redness  Assessment: only 1 blood cx drawn: aerobic bottle only has gram positive cocci in pairs and chains  ? Bacteremia from L hip abscess/cellulitis  Name of physician (or Provider) Contacted: [facility] via [place]  Current antibiotics: Vancomycin  Changes to prescribed antibiotics recommended: add Ceftriaxone 2 gm IV q24   No results found for this or any previous visit.  Eudelia Bunch, Pharm.D 07/06/2019 11:45 AM

## 2019-07-06 NOTE — Consult Note (Addendum)
ORTHOPAEDIC CONSULTATION  REQUESTING PHYSICIAN: Nita Sells, MD  Chief Complaint: Left hip pain and redness  HPI: Wendy Ray is a 69 y.o. female with history of hypertension who presented to the ED last night with complaints of fever, left hip pain, and headache. She had a left hip replacement on 05/19/19 with Dr. Mardelle Matte. She has since had an uneventful recovery with increases of ROM and strength in left hip. On 2/21 she noticed some new body aches and no other symptoms, she woke up on 2/22 with an excruciating headache and fatigue. She originally thought these were side of effects of her COVID-19 vaccine which she received on 2/12. When she woke up on 2/23, she felt fine, however later that afternoon she began to have severe left hip pain located on her lateral thigh, redness on her lateral thigh, nausea, vomiting, fever, chills, and headaches. She was able to maintain her ROM but was worried about a fall and used a walker for support when walking.  She presented [facility] ED that [facility].   Patient walks without any assistive devices at baseline. She states since being admitted pain is less in lateral thigh and more in groin. Pain is made worse by movement but has no pain at rest. She continues to have a severe headache which is not helped by tylenol but no longer feeling fever or chills. Denies cough, SOB, chest pain, or dysuria.       Past Medical History:  Diagnosis Date  . Arthritis   . Hypertension   . Primary localized osteoarthritis of right hip 01/22/2016   Past Surgical History:  Procedure Laterality Date  . BACK SURGERY  2003  . CERVICAL DISCECTOMY    . COLONOSCOPY    . JOINT REPLACEMENT    . TOTAL HIP ARTHROPLASTY Right 01/22/2016   Procedure: RIGHT TOTAL HIP ARTHROPLASTY;  Surgeon: Marchia Bond, MD;  Location: [place];  Service: Orthopedics;  Laterality: Right;  . TOTAL HIP ARTHROPLASTY Left 05/17/2019   Procedure: TOTAL HIP ARTHROPLASTY;  Surgeon: Marchia Bond, MD;  Location: [facility];  Service: Orthopedics;  Laterality: Left;   Social History   Socioeconomic History  . Marital status: Married    Spouse name: Not on file  . Number of children: Not on file  . Years of education: Not on file  . Highest education level: Not on file  Occupational History  . Not on file  Tobacco Use  . Smoking status: Never Smoker  . Smokeless tobacco: Never Used  Substance and Sexual Activity  . Alcohol use: Yes    Comment: occasionally  . Drug use: No  . Sexual activity: Not on file  Other Topics Concern  . Not on file  Social History Narrative  . Not on file   Social Determinants of Health   Financial Resource Strain:   . Difficulty of Paying Living Expenses: Not on file  Food Insecurity:   . Worried About Charity fundraiser in the Last Year: Not on file  . Ran Out of Food in the Last Year: Not on file  Transportation Needs:   . Lack of Transportation (Medical): Not on file  . Lack of Transportation (Non-Medical): Not on file  Physical Activity:   . Days of Exercise per Week: Not on file  . Minutes of Exercise per Session: Not on file  Stress:   . Feeling of Stress : Not on file  Social Connections:   . Frequency of Communication with Friends  and Family: Not on file  . Frequency of Social Gatherings with Friends and Family: Not on file  . Attends Religious Services: Not on file  . Active Member of Clubs or Organizations: Not on file  . Attends Archivist Meetings: Not on file  . Marital Status: Not on file   Family History  Problem Relation Age of Onset  . Colon cancer Mother   . Prostate cancer Father   . Colon polyps Sister   . Esophageal cancer Neg Hx   . Rectal cancer Neg Hx   . Stomach cancer Neg Hx    No Known Allergies   Positive ROS: All other systems have been reviewed and were otherwise negative with the exception of those mentioned in the HPI and as above.  Physical Exam: General: Alert, laying in bed  no acute distress. Cardiovascular: No pedal edema Respiratory: No cyanosis, no use of accessory musculature GI: No organomegaly, abdomen is soft and non-tender Skin: See photos below Neurologic: Sensation intact distally Psychiatric: Patient is competent for consent with normal mood and affect Lymphatic: No axillary or cervical lymphadenopathy  MUSCULOSKELETAL:  Left Lower Extremity - Diffuse erythema to lateral left thigh which does not extend into groin. Left hip arthroplasty surgical incision appears well healed with erythema largely located near proximal incision line. Left lateral thigh warm to touch. No TTP along incision line. No TTP in left groin. Full ROM of left knee, left ankle, and all toes of left foot. Distal sensation intact. Active forward flexion of left hip 0-80 degrees with mild pain. Able to lay comfortably in bed supine and without pain with hip and knee flexed and foot flat on bed.          CT of left hip showed abscess or postoperative seroma in the subcutaneous fat lateral to the left hip along the surgical incision track  Assessment: Active Problems:   Cellulitis  Cellulitis of left hip, s/p left hip arthroplasty 6 weeks ago  Plan: - NPO after midnight - Will consult IR for image guided arthrocentesis, will keep NPO until this has been performed - Will plan for irrigation and debridement and polyethylene exchange tomorrow afternoon with Dr. Mardelle Matte [name] over plan with patient and she agrees with current plan - Per patient request, called patient's daughter [name] and went over findings with her as well as plan, she agrees with current plan and requests to be updated on any changes in her mother's health or course of treatment moving forward.   Ventura Bruns, PA-C Cell [phone]   07/06/2019 9:42 AM

## 2019-07-06 NOTE — Progress Notes (Signed)
Hospitalist progress note   Patient from home, Patient going possibly home, Dispo inpatient  Wendy Ray [id number] DOB: 01/17/1951 DOA: 07/05/2019  PCP: Leanna Battles, MD   Narrative:  28 white female HTN HLD reflux  prior right hip replacement 2017-underwent surgery for avascular necrosis of left hip status post T HA 05/16/2018 Return to hospital severe headache fever myalgias 2/23 and found to have left hip pain Orthopedics consulted Also found to have AKI on admission  Data Reviewed:  On admission BUN/creatinine up from baseline 26/1.07-->29/1.7-->29/1.8 CRP 18 ESR 50 orthopedics WBC 12.4-->14.2 Hemoglobin 12.3-->10.8  Assessment & Plan:  Cellulitis versus infected hardware of hip IR to evaluate patient n.p.o. patient to get sample of abscess area--this may dictate further orthopedic management and possible polyethylene exchange from hip Continue oxycodone 5 mg every 4 as needed Continue vancomycin-ceftriaxone was added gram-positive cocci in pairs-await culture to narrow AKI BUN/creatinine elevated likely in part secondary to ibuprofen use and underlying use of Hyzaar and may be Mobic-all of been discontinued at this time Hydrate with saline 100 cc/h and monitor HTN Continue amlodipine 5 mg daily  Subjective: Awake alert coherent-orthopedic PA just left the room Feels pain is a little bit better but still present No fever no chills Consultants:   Orthopedics  Objective: Vitals:   07/05/19 2230 07/05/19 2337 07/06/19 0527 07/06/19 0949  BP: 124/77 107/69 (!) 106/59 117/70  Pulse: (!) 105 (!) 101 89 98  Resp: '14 16 16 20  ' Temp:  99.8 F (37.7 C) 97.8 F (36.6 C) 98 F (36.7 C)  TempSrc:  Oral Oral Oral  SpO2: 96% 97% 96%   Weight:      Height:        Intake/Output Summary (Last 24 hours) at 07/06/2019 1007 Last data filed at 07/06/2019 1747 Gross per 24 hour  Intake 1559.99 ml  Output 900 ml  Net 659.99 ml   Filed Weights   07/05/19 1805  Weight:  109.8 kg    Examination: Awake pleasant no distress EOMI includes S1-S2 no murmur rub or gallop ROM intact to lower extremity however some limitation secondary to pain abdomen soft nontender no rebound neurologically intact  Scheduled Meds: . amLODipine  5 mg Oral Daily  . heparin  5,000 Units Subcutaneous Q12H  . pravastatin  20 mg Oral Daily   Continuous Infusions: . sodium chloride 100 mL/hr at 07/06/19 1007  . vancomycin       LOS: 1 day   Time spent: [place], MD [facility]  07/06/2019, 10:07 AM

## 2019-07-06 NOTE — Progress Notes (Signed)
Pharmacy Antibiotic Note  Wendy Ray is a 69 y.o. female admitted on 07/05/2019 with cellulitis.  Pharmacy has been consulted for vancomycin dosing.  Plan: Vancomycin 2gm iv x1, then Vancomycin 750 mg IV Q 24 hrs. Goal AUC 400-550. Expected AUC: 500 SCr used: 1.73 Vd: 0.5   Height: 5\' 5"  (165.1 cm) Weight: 242 lb (109.8 kg) IBW/kg (Calculated) : 57  Temp (24hrs), Avg:99.1 F (37.3 C), Min:97.8 F (36.6 C), Max:99.8 F (37.7 C)  Recent Labs  Lab 07/05/19 1828  WBC 12.4*  CREATININE 1.73*    Estimated Creatinine Clearance: 37.8 mL/min (A) (by C-G formula based on SCr of 1.73 mg/dL (H)).    No Known Allergies  Antimicrobials this admission: Vancomycin 07/05/2019 >>  Dose adjustments this admission: -  Microbiology results: -  Thank you for allowing pharmacy to be a part of this patient's care.  Wendy Ray 07/06/2019 5:38 AM

## 2019-07-07 ENCOUNTER — Inpatient Hospital Stay (HOSPITAL_COMMUNITY): Payer: PPO | Admitting: Anesthesiology

## 2019-07-07 ENCOUNTER — Encounter (HOSPITAL_COMMUNITY): Payer: Self-pay | Admitting: Internal Medicine

## 2019-07-07 ENCOUNTER — Encounter (HOSPITAL_COMMUNITY)
Admission: EM | Disposition: A | Discharge status: Home / Self Care | Payer: Self-pay | Source: Home / Self Care | Attending: Family Medicine

## 2019-07-07 ENCOUNTER — Inpatient Hospital Stay (HOSPITAL_COMMUNITY): Payer: PPO

## 2019-07-07 HISTORY — PX: INCISION AND DRAINAGE HIP: SHX1801

## 2019-07-07 LAB — CBC
HCT: 32.6 % — ABNORMAL LOW (ref 36.0–46.0)
Hemoglobin: 10.7 g/dL — ABNORMAL LOW (ref 12.0–15.0)
MCH: 31.9 pg (ref 26.0–34.0)
MCHC: 32.8 g/dL (ref 30.0–36.0)
MCV: 97.3 fL (ref 80.0–100.0)
Platelets: 134 10*3/uL — ABNORMAL LOW (ref 150–400)
RBC: 3.35 MIL/uL — ABNORMAL LOW (ref 3.87–5.11)
RDW: 14.1 % (ref 11.5–15.5)
WBC: 9.8 10*3/uL (ref 4.0–10.5)
nRBC: 0 % (ref 0.0–0.2)

## 2019-07-07 LAB — BASIC METABOLIC PANEL
Anion gap: 11 (ref 5–15)
BUN: 27 mg/dL — ABNORMAL HIGH (ref 8–23)
CO2: 20 mmol/L — ABNORMAL LOW (ref 22–32)
Calcium: 8 mg/dL — ABNORMAL LOW (ref 8.9–10.3)
Chloride: 108 mmol/L (ref 98–111)
Creatinine, Ser: 1.35 mg/dL — ABNORMAL HIGH (ref 0.44–1.00)
GFR calc Af Amer: 46 mL/min — ABNORMAL LOW (ref >60)
GFR calc non Af Amer: 40 mL/min — ABNORMAL LOW (ref >60)
Glucose, Bld: 116 mg/dL — ABNORMAL HIGH (ref 70–99)
Potassium: 3 mmol/L — ABNORMAL LOW (ref 3.5–5.1)
Sodium: 139 mmol/L (ref 135–145)

## 2019-07-07 LAB — ABO/RH: ABO/RH(D): A POS

## 2019-07-07 SURGERY — IRRIGATION AND DEBRIDEMENT HIP WITH POLY EXCHANGE
Anesthesia: General | Site: Hip | Laterality: Left

## 2019-07-07 MED ORDER — ONDANSETRON HCL 4 MG PO TABS: 4.0000 mg | ORAL_TABLET | Freq: Four times a day (QID) | ORAL | Status: DC | PRN

## 2019-07-07 MED ORDER — LACTATED RINGERS IV SOLN: INTRAVENOUS | Status: DC

## 2019-07-07 MED ORDER — PHENYLEPHRINE HCL-NACL 10-0.9 MG/250ML-% IV SOLN
INTRAVENOUS | Status: DC | PRN
  Administered 2019-07-07: 50 ug/min via INTRAVENOUS

## 2019-07-07 MED ORDER — ONDANSETRON HCL 4 MG/2ML IJ SOLN: 4.0000 mg | Freq: Once | INTRAMUSCULAR | Status: DC | PRN

## 2019-07-07 MED ORDER — DEXAMETHASONE SODIUM PHOSPHATE 10 MG/ML IJ SOLN
10.0000 mg | Freq: Once | INTRAMUSCULAR | Status: AC
  Administered 2019-07-08: 10 mg via INTRAVENOUS
  Filled 2019-07-07: qty 1

## 2019-07-07 MED ORDER — DOCUSATE SODIUM 100 MG PO CAPS
100.0000 mg | ORAL_CAPSULE | Freq: Two times a day (BID) | ORAL | Status: DC
  Administered 2019-07-07 – 2019-07-08 (×2): 100 mg via ORAL
  Filled 2019-07-07 (×2): qty 1

## 2019-07-07 MED ORDER — METHOCARBAMOL 500 MG PO TABS
500.0000 mg | ORAL_TABLET | Freq: Four times a day (QID) | ORAL | Status: DC | PRN
  Administered 2019-07-09 – 2019-07-10 (×3): 500 mg via ORAL
  Filled 2019-07-07 (×3): qty 1

## 2019-07-07 MED ORDER — DIPHENHYDRAMINE HCL 12.5 MG/5ML PO ELIX: 12.5000 mg | ORAL_SOLUTION | ORAL | Status: DC | PRN

## 2019-07-07 MED ORDER — PROPOFOL 10 MG/ML IV BOLUS
INTRAVENOUS | Status: DC | PRN
  Administered 2019-07-07: 160 mg via INTRAVENOUS

## 2019-07-07 MED ORDER — SODIUM CHLORIDE 0.9 % IV SOLN: INTRAVENOUS | Status: DC

## 2019-07-07 MED ORDER — IRRISEPT - 450ML BOTTLE WITH 0.05% CHG IN STERILE WATER, USP 99.95% OPTIME
TOPICAL | Status: DC | PRN
  Administered 2019-07-07: 450 mL

## 2019-07-07 MED ORDER — METOCLOPRAMIDE HCL 5 MG/ML IJ SOLN: 5.0000 mg | Freq: Three times a day (TID) | INTRAMUSCULAR | Status: DC | PRN

## 2019-07-07 MED ORDER — PROPOFOL 10 MG/ML IV BOLUS
INTRAVENOUS | Status: AC
  Filled 2019-07-07: qty 20

## 2019-07-07 MED ORDER — METHOCARBAMOL 1000 MG/10ML IJ SOLN
500.0000 mg | Freq: Four times a day (QID) | INTRAVENOUS | Status: DC | PRN
  Filled 2019-07-07: qty 5

## 2019-07-07 MED ORDER — ONDANSETRON HCL 4 MG/2ML IJ SOLN: 4.0000 mg | Freq: Four times a day (QID) | INTRAMUSCULAR | Status: DC | PRN

## 2019-07-07 MED ORDER — DEXAMETHASONE SODIUM PHOSPHATE 10 MG/ML IJ SOLN
INTRAMUSCULAR | Status: DC | PRN
  Administered 2019-07-07: 10 mg via INTRAVENOUS

## 2019-07-07 MED ORDER — SODIUM CHLORIDE 0.9 % IR SOLN
Status: DC | PRN
  Administered 2019-07-07: 6000 mL

## 2019-07-07 MED ORDER — METOCLOPRAMIDE HCL 5 MG PO TABS: 5.0000 mg | ORAL_TABLET | Freq: Three times a day (TID) | ORAL | Status: DC | PRN

## 2019-07-07 MED ORDER — MIDAZOLAM HCL 5 MG/5ML IJ SOLN
INTRAMUSCULAR | Status: DC | PRN
  Administered 2019-07-07: 2 mg via INTRAVENOUS

## 2019-07-07 MED ORDER — 0.9 % SODIUM CHLORIDE (POUR BTL) OPTIME
TOPICAL | Status: DC | PRN
  Administered 2019-07-07: 1000 mL

## 2019-07-07 MED ORDER — FENTANYL CITRATE (PF) 100 MCG/2ML IJ SOLN: 25.0000 ug | INTRAMUSCULAR | Status: DC | PRN

## 2019-07-07 MED ORDER — POLYETHYLENE GLYCOL 3350 17 G PO PACK: 17.0000 g | PACK | Freq: Every day | ORAL | Status: DC | PRN

## 2019-07-07 MED ORDER — ALBUMIN HUMAN 5 % IV SOLN: INTRAVENOUS | Status: DC | PRN

## 2019-07-07 MED ORDER — SODIUM CHLORIDE 0.9% IV SOLUTION: Freq: Once | INTRAVENOUS | Status: DC

## 2019-07-07 MED ORDER — ENOXAPARIN SODIUM 40 MG/0.4ML ~~LOC~~ SOLN
40.0000 mg | SUBCUTANEOUS | Status: DC
  Administered 2019-07-08 – 2019-07-11 (×4): 40 mg via SUBCUTANEOUS
  Filled 2019-07-07 (×4): qty 0.4

## 2019-07-07 MED ORDER — PHENOL 1.4 % MT LIQD
1.0000 | OROMUCOSAL | Status: DC | PRN
  Filled 2019-07-07: qty 177

## 2019-07-07 MED ORDER — ISOPROPYL ALCOHOL 70 % SOLN
Status: DC | PRN
  Administered 2019-07-07: 1 via TOPICAL

## 2019-07-07 MED ORDER — FENTANYL CITRATE (PF) 100 MCG/2ML IJ SOLN
INTRAMUSCULAR | Status: DC | PRN
  Administered 2019-07-07: 100 ug via INTRAVENOUS
  Administered 2019-07-07 (×3): 50 ug via INTRAVENOUS

## 2019-07-07 MED ORDER — ALUM & MAG HYDROXIDE-SIMETH 200-200-20 MG/5ML PO SUSP: 30.0000 mL | ORAL | Status: DC | PRN

## 2019-07-07 MED ORDER — ACETAMINOPHEN 650 MG RE SUPP
650.0000 mg | Freq: Once | RECTAL | Status: AC
  Administered 2019-07-07: 650 mg via RECTAL
  Filled 2019-07-07: qty 1

## 2019-07-07 MED ORDER — POTASSIUM CHLORIDE CRYS ER 20 MEQ PO TBCR
40.0000 meq | EXTENDED_RELEASE_TABLET | Freq: Every day | ORAL | Status: DC
  Administered 2019-07-07 – 2019-07-11 (×5): 40 meq via ORAL
  Filled 2019-07-07 (×5): qty 2

## 2019-07-07 MED ORDER — PHENYLEPHRINE 40 MCG/ML (10ML) SYRINGE FOR IV PUSH (FOR BLOOD PRESSURE SUPPORT)
PREFILLED_SYRINGE | INTRAVENOUS | Status: AC
  Filled 2019-07-07: qty 10

## 2019-07-07 MED ORDER — ONDANSETRON HCL 4 MG/2ML IJ SOLN
INTRAMUSCULAR | Status: AC
  Filled 2019-07-07: qty 2

## 2019-07-07 MED ORDER — OXYCODONE HCL 5 MG PO TABS: 5.0000 mg | ORAL_TABLET | Freq: Once | ORAL | Status: DC | PRN

## 2019-07-07 MED ORDER — SUCCINYLCHOLINE CHLORIDE 20 MG/ML IJ SOLN
INTRAMUSCULAR | Status: DC | PRN
  Administered 2019-07-07: 120 mg via INTRAVENOUS

## 2019-07-07 MED ORDER — OXYCODONE HCL 5 MG/5ML PO SOLN: 5.0000 mg | Freq: Once | ORAL | Status: DC | PRN

## 2019-07-07 MED ORDER — MENTHOL 3 MG MT LOZG: 1.0000 | LOZENGE | OROMUCOSAL | Status: DC | PRN

## 2019-07-07 MED ORDER — PHENYLEPHRINE 40 MCG/ML (10ML) SYRINGE FOR IV PUSH (FOR BLOOD PRESSURE SUPPORT)
PREFILLED_SYRINGE | INTRAVENOUS | Status: AC
  Filled 2019-07-07: qty 20

## 2019-07-07 MED ORDER — SUGAMMADEX SODIUM 200 MG/2ML IV SOLN
INTRAVENOUS | Status: DC | PRN
  Administered 2019-07-07: 250 mg via INTRAVENOUS

## 2019-07-07 MED ORDER — PHENYLEPHRINE 40 MCG/ML (10ML) SYRINGE FOR IV PUSH (FOR BLOOD PRESSURE SUPPORT)
PREFILLED_SYRINGE | INTRAVENOUS | Status: DC | PRN
  Administered 2019-07-07: 120 ug via INTRAVENOUS
  Administered 2019-07-07 (×2): 80 ug via INTRAVENOUS
  Administered 2019-07-07 (×2): 120 ug via INTRAVENOUS
  Administered 2019-07-07 (×3): 80 ug via INTRAVENOUS

## 2019-07-07 MED ORDER — EPHEDRINE SULFATE-NACL 50-0.9 MG/10ML-% IV SOSY
PREFILLED_SYRINGE | INTRAVENOUS | Status: DC | PRN
  Administered 2019-07-07 (×2): 10 mg via INTRAVENOUS

## 2019-07-07 MED ORDER — ROCURONIUM BROMIDE 10 MG/ML (PF) SYRINGE
PREFILLED_SYRINGE | INTRAVENOUS | Status: DC | PRN
  Administered 2019-07-07: 40 mg via INTRAVENOUS
  Administered 2019-07-07: 20 mg via INTRAVENOUS

## 2019-07-07 MED ORDER — LIDOCAINE 2% (20 MG/ML) 5 ML SYRINGE
INTRAMUSCULAR | Status: DC | PRN
  Administered 2019-07-07: 60 mg via INTRAVENOUS

## 2019-07-07 MED ORDER — VANCOMYCIN HCL 750 MG/150ML IV SOLN: 750.0000 mg | INTRAVENOUS | Status: DC

## 2019-07-07 MED ORDER — ONDANSETRON HCL 4 MG/2ML IJ SOLN
INTRAMUSCULAR | Status: DC | PRN
  Administered 2019-07-07: 4 mg via INTRAVENOUS

## 2019-07-07 MED ORDER — ALBUMIN HUMAN 5 % IV SOLN
INTRAVENOUS | Status: AC
  Filled 2019-07-07: qty 250

## 2019-07-07 MED ORDER — MIDAZOLAM HCL 2 MG/2ML IJ SOLN
INTRAMUSCULAR | Status: AC
  Filled 2019-07-07: qty 2

## 2019-07-07 MED ORDER — FENTANYL CITRATE (PF) 250 MCG/5ML IJ SOLN
INTRAMUSCULAR | Status: AC
  Filled 2019-07-07: qty 5

## 2019-07-07 SURGICAL SUPPLY — 74 items
BAG ZIPLOCK 12X15 (MISCELLANEOUS) ×2 IMPLANT
BALL HIP ARTICU EZE 36 8.5 (Hips) ×1 IMPLANT
BNDG COHESIVE 3X5 TAN STRL LF (GAUZE/BANDAGES/DRESSINGS) ×2 IMPLANT
CANISTER WOUNDNEG PRESSURE 500 (CANNISTER) ×2 IMPLANT
CHLORAPREP W/TINT 26 (MISCELLANEOUS) ×4 IMPLANT
CNTNR URN SCR LID CUP LEK RST (MISCELLANEOUS) ×2 IMPLANT
CONT SPEC 4OZ STRL OR WHT (MISCELLANEOUS) ×2
COVER SURGICAL LIGHT HANDLE (MISCELLANEOUS) ×2 IMPLANT
DRAPE HIP W/POCKET STRL (MISCELLANEOUS) ×2 IMPLANT
DRAPE IMP U-DRAPE 54X76 (DRAPES) ×2 IMPLANT
DRAPE INCISE IOBAN 66X45 STRL (DRAPES) ×2 IMPLANT
DRAPE ORTHO SPLIT 77X108 STRL (DRAPES)
DRAPE SHEET LG 3/4 BI-LAMINATE (DRAPES) ×8 IMPLANT
DRAPE STERI IOBAN 125X83 (DRAPES) ×2 IMPLANT
DRAPE SURG ORHT 6 SPLT 77X108 (DRAPES) IMPLANT
DRAPE U-SHAPE 47X51 STRL (DRAPES) ×4 IMPLANT
DRESSING PEEL AND PLAC PRVNA20 (GAUZE/BANDAGES/DRESSINGS) IMPLANT
DRESSING PEEL AND PLC PRVNA 13 (GAUZE/BANDAGES/DRESSINGS) IMPLANT
DRESSING PREVENA PLUS CUSTOM (GAUZE/BANDAGES/DRESSINGS) ×2 IMPLANT
DRSG AQUACEL AG ADV 3.5X10 (GAUZE/BANDAGES/DRESSINGS) IMPLANT
DRSG PEEL AND PLACE PREVENA 13 (GAUZE/BANDAGES/DRESSINGS)
DRSG PEEL AND PLACE PREVENA 20 (GAUZE/BANDAGES/DRESSINGS)
DRSG PREVENA PLUS CUSTOM (GAUZE/BANDAGES/DRESSINGS) ×4
DRSG TEGADERM 2-3/8X2-3/4 SM (GAUZE/BANDAGES/DRESSINGS) ×4 IMPLANT
ELECT BLADE TIP CTD 4 INCH (ELECTRODE) ×2 IMPLANT
ELECT REM PT RETURN 15FT ADLT (MISCELLANEOUS) ×2 IMPLANT
EVACUATOR 1/8 PVC DRAIN (DRAIN) ×2 IMPLANT
EVACUATOR DRAINAGE 10X20 100CC (DRAIN) ×1 IMPLANT
EVACUATOR SILICONE 100CC (DRAIN) ×1
GAUZE SPONGE 2X2 8PLY STRL LF (GAUZE/BANDAGES/DRESSINGS) ×2 IMPLANT
GLOVE BIO SURGEON STRL SZ8.5 (GLOVE) ×10 IMPLANT
GLOVE BIOGEL PI IND STRL 8.5 (GLOVE) ×1 IMPLANT
GLOVE BIOGEL PI INDICATOR 8.5 (GLOVE) ×1
GOWN SPEC L3 XXLG W/TWL (GOWN DISPOSABLE) ×2 IMPLANT
HANDPIECE INTERPULSE COAX TIP (DISPOSABLE) ×1
HEAD M SROM 36MM PLUS 1.5 (Hips) ×1 IMPLANT
HIP BALL ARTICU EZE 36 8.5 (Hips) ×2 IMPLANT
HOOD PEEL AWAY FLYTE STAYCOOL (MISCELLANEOUS) ×6 IMPLANT
JET LAVAGE IRRISEPT WOUND (IRRIGATION / IRRIGATOR) ×2
KIT DRSG PREVENA PLUS 7DAY 125 (MISCELLANEOUS) ×2 IMPLANT
KIT TURNOVER KIT A (KITS) IMPLANT
LAVAGE JET IRRISEPT WOUND (IRRIGATION / IRRIGATOR) ×1 IMPLANT
LINER NEUTRAL 52X36MM PLUS 4 (Liner) ×2 IMPLANT
MANIFOLD NEPTUNE II (INSTRUMENTS) ×2 IMPLANT
MARKER SKIN DUAL TIP RULER LAB (MISCELLANEOUS) ×2 IMPLANT
NDL SAFETY ECLIPSE 18X1.5 (NEEDLE) IMPLANT
NEEDLE HYPO 18GX1.5 SHARP (NEEDLE)
NEEDLE SPNL 18GX3.5 QUINCKE PK (NEEDLE) IMPLANT
NS IRRIG 1000ML POUR BTL (IV SOLUTION) ×2 IMPLANT
PACK TOTAL JOINT (CUSTOM PROCEDURE TRAY) ×2 IMPLANT
PENCIL SMOKE EVACUATOR (MISCELLANEOUS) ×2 IMPLANT
PILLOW ABDUCTION MEDIUM (MISCELLANEOUS) ×2 IMPLANT
SET HNDPC FAN SPRY TIP SCT (DISPOSABLE) ×1 IMPLANT
SPONGE GAUZE 2X2 STER 10/PKG (GAUZE/BANDAGES/DRESSINGS) ×2
SPONGE LAP 18X18 RF (DISPOSABLE) ×4 IMPLANT
SROM M HEAD 36MM PLUS 1.5 (Hips) ×2 IMPLANT
STAPLER VISISTAT 35W (STAPLE) IMPLANT
SUT ETHILON 2 0 PSLX (SUTURE) ×12 IMPLANT
SUT ETHILON 3 0 PS 1 (SUTURE) ×2 IMPLANT
SUT MNCRL AB 3-0 PS2 18 (SUTURE) IMPLANT
SUT MNCRL AB 4-0 PS2 18 (SUTURE) IMPLANT
SUT MON AB 2-0 CT1 36 (SUTURE) ×8 IMPLANT
SUT PDS AB 1 CT1 27 (SUTURE) ×4 IMPLANT
SUT STRATAFIX PDO 1 14 VIOLET (SUTURE) ×1
SUT STRATFX PDO 1 14 VIOLET (SUTURE) ×1
SUT VIC AB 2-0 CT1 27 (SUTURE) ×2
SUT VIC AB 2-0 CT1 TAPERPNT 27 (SUTURE) ×2 IMPLANT
SUTURE STRATFX PDO 1 14 VIOLET (SUTURE) ×1 IMPLANT
SWAB COLLECTION DEVICE MRSA (MISCELLANEOUS) ×4 IMPLANT
SWAB CULTURE ESWAB REG 1ML (MISCELLANEOUS) ×4 IMPLANT
SYR 3ML LL SCALE MARK (SYRINGE) IMPLANT
TRAY FOLEY MTR SLVR 16FR STAT (SET/KITS/TRAYS/PACK) IMPLANT
TRAY PREP A LATEX SAFE STRL (SET/KITS/TRAYS/PACK) ×2 IMPLANT
YANKAUER SUCT BULB TIP 10FT TU (MISCELLANEOUS) ×2 IMPLANT

## 2019-07-07 NOTE — Transfer of Care (Signed)
Immediate Anesthesia Transfer of Care Note  Patient: Wendy Ray  Procedure(s) Performed: DEBRIDEMENT OF LEFT HIP WITH HEAD BALL LINER EXCHANGE (Left Hip)  Patient Location: PACU  Anesthesia Type:General  Level of Consciousness: awake, alert  and oriented  Airway & Oxygen Therapy: Patient Spontanous Breathing and Patient connected to face mask oxygen  Post-op Assessment: Report given to RN and Post -op Vital signs reviewed and stable  Post vital signs: Reviewed and stable  Last Vitals:  Vitals Value Taken Time  BP 119/54 07/07/19 1442  Temp    Pulse    Resp 12 07/07/19 1444  SpO2    Vitals shown include unvalidated device data.  Last Pain:  Vitals:   07/07/19 1017  TempSrc:   PainSc: 0-No pain      Patients Stated Pain Goal: 1 ([id number])  Complications: No apparent anesthesia complications

## 2019-07-07 NOTE — Op Note (Signed)
OPERATIVE REPORT   07/07/2019  1:56 PM  PATIENT:  Wendy Ray   SURGEON:  Bertram Savin, MD  ASSISTANT:  Nehemiah Massed, PA-C.   PREOPERATIVE DIAGNOSIS: Periprosthetic joint infection, left hip.  POSTOPERATIVE DIAGNOSIS:  Same.  PROCEDURE: Debridement of left hip with head ball and liner exchange.  ANESTHESIA:   GETA.  ANTIBIOTICS: Patient is receiving scheduled vancomycin and ceftriaxone on the floor.  EXPLANTS: DePuy Altrx polyliner, size 52 x 36, +4 neutral. Metal femoral head ball, 36 +1.5 mm.  IMPLANTS:   DePuy Altrx polyliner, size 52 x 36, +4 neutral. Metal femoral head ball, 36 + 8.5 mm.  SPECIMENS: Left hip abscess for aerobic and anaerobic culture. Left hip pseudocapsule for tissue culture.  TUBES AND DRAINS: Medium Hemovac x1. 10 mm flat JP drain x1. Negative pressure incisional dressing at 0000000 mmHg  COMPLICATIONS: None.  DISPOSITION: Stable to PACU.  SURGICAL INDICATIONS:  [name] is a 69 y.o. female with a diagnosis of acute left hip periprosthetic joint infection.  She underwent primary left total hip arthroplasty posterior approach by Dr. Mardelle Matte on 05/17/2019.  She was doing well.  She began developing pain and erythema within 1 week.  She was admitted to the hospital with elevated serum white blood cell count, C-reactive protein, and sed rate.  She was started on IV antibiotics.  CT scan of the left hip was obtained, showing large subcutaneous fluid collection.  Unsuccessful image guided aspiration was performed of the left hip by interventional radiology.  I was asked to evaluate the patient by Dr. Mardelle Matte.  She was indicated for debridement and possible head ball and liner exchange in an attempt to salvage her prosthesis.  The risks, benefits, and alternatives were discussed with the patient preoperatively including but not limited to the risks of infection, bleeding, nerve / blood vessel injury, cardiopulmonary complications, the need for  repeat surgery, among others, and the patient was willing to proceed.  She understands that if she fails treatment, she will require a two-stage procedure.  PROCEDURE IN DETAIL: The patient was identified in the holding area using 2 identifiers.  The surgical site was marked by myself.  She was taken to the operating room, and placed supine on the operating room table.  General anesthesia was induced.  Foley catheter was placed.  She was flipped to the right lateral decubitus position.  She was positioned with a hip positioner.  Axillary roll was placed.  All bony prominences were well-padded.  The left hip was prepped and draped in the normal sterile surgical fashion.  Timeout was called, verifying site and site of surgery.  She is already receiving scheduled IV antibiotics.  Using a #10 blade, I excised her previous posterior incision.  Upon incising into the subcutaneous tissues, a large collection of seropurulent fluid was emanating from the wound under pressure.  This fluid was swabbed for culture and then evacuated.  Full-thickness skin flaps were created.  There was a large defect in the fascia distally.  The entirety of the previous fascial incision was opened.  The sciatic nerve was palpated and protected throughout the case. I excisionally debrided all necrotic fatty tissue using curved Mayo scissors.  The wound was irrigated with saline.  Charnley retractor was placed.  I clipped the sutures of her posterior capsular repair, and they were removed.  The hip was dislocated carefully using a bone hook.  The head was removed from the trunnion using a bone tamp.  The trunnion was  intact without damage or corrosion.  Scar tissue was removed from the interface of the femoral implant in the proximal femur.  There was no obvious loosening.  I performed a 360 degree circumferential capsulectomy around the socket in order to mobilize the femur adequately.  Acetabular retractors were placed and the trunnion was  delivered anterior to the shell.  Using the extraction tool, I easily remove the poly liner.  The acetabular component was well fixed.  I excisionally debrided all the fibrous scar tissue in the periarticular area.  A representative sample of this tissue was sent for tissue culture.  There was no significant necrotic tissue around the actual prosthesis.  The wound was irrigated with irrisept solution followed by 6 L of normal saline using pulse lavage.  New acetabular liner was placed.  Trial heads were utilized.  The 36+8.5 head was required to recreate adequate stability.  The real head was impacted on the trunnion.  The hip was reduced.  In a position of 90 degrees of flexion and neutral abduction, the hip was stable to greater than 70 degrees of internal rotation.  She was stable in body position.  She was stable in full extension and external rotation.  The fascia was closed using #1 PDS and #1 strata fix over a medium Hemovac drain which I brought out in line with the incision distally.  The deep fatty tissue was closed using 2-0 Monocryl over a 10 mm flat JP drain in the subcutaneous tissue which was brought out in line with the incision distally as well.  Skin was reapproximated with 2-0 nylon sutures utilizing a vertical mattress technique.  Customized Prevena incisional negative pressure dressing was applied according to manufacturer's instructions.  Suction was placed at 125 mmHg with excellent seal.  The patient was then flipped supine.  An abduction wedge was placed.  She was then extubated and taken to the PACU in stable condition.  Sponge, needle, and instrument counts were correct at the end of the case x2 there were no known complications.  POSTOPERATIVE PLAN: Postoperatively, the patient be readmitted to the hospitalist.  She may weight-bear as tolerated.  Posterior hip precautions.  Abduction wedge while in bed.  Start Lovenox for DVT prophylaxis, and discharge on aspirin 81 mg PO BID for 6  weeks.  Continue all drains until the appropriate time.  Plan to convert VAC to portable PRevena unit upon discharge. Patient will require infectious disease consult and PICC placement.  We will follow intraoperative cultures.  Plan for minimum of 6 weeks IV antibiotics followed by lifelong oral suppression.  She will return to the office 7 days after discharge for removal of her incisional VAC.

## 2019-07-07 NOTE — Progress Notes (Signed)
Subjective:    Patient is alert, oriented, laying in bed. Patient states pain is mild and is worse with movement. She however was able to ambulate earlier this morning without any problems. Denies chest pain, SOB, nausea, or vomitting. She states she continues to have an excruciating headache.   Objective:  PE: VITALS:   Vitals:   07/06/19 1249 07/06/19 1547 07/06/19 2126 07/07/19 0528  BP: (!) 96/55 105/63 105/64 (!) 143/70  Pulse: 85 90 86 89  Resp:   17 19  Temp: 98.3 F (36.8 C) (!) 100.8 F (38.2 C) 98.9 F (37.2 C) (!) 100.9 F (38.3 C)  TempSrc: Oral Oral Oral Oral  SpO2: 95% 97% 97% 96%  Weight:      Height:        General: Alert, laying in bed no acute distress. Respiratory: No cyanosis, no use of accessory musculature GI: abdomen is soft and non-tender Skin: See photos below. 1 cm scab on anterior thigh with erythema.  MUSCULOSKELETAL:  left Lower Extremity - Diffuse erythema to lateral left thigh, please see today's photos below. Left hip arthroplasty surgical incision appears well healed with erythema largely located near proximal incision line. Left lateral thigh warm to touch. No TTP along incision line. No TTP in left groin. Full ROM of left knee, left ankle, and all toes of left foot. Distal sensation intact. Active forward flexion of left hip 0-80 degrees with mild pain. No change in ROM overnight. Able to lay comfortably in bed supine and without pain with hip and knee flexed and foot flat on bed.      LABS  Results for orders placed or performed during the hospital encounter of 07/05/19 (from the past 24 hour(s))  Surgical pcr screen     Status: Abnormal   Collection Time: 07/06/19 12:34 PM   Specimen: Nasal Mucosa; Nasal Swab  Result Value Ref Range   MRSA, PCR NEGATIVE NEGATIVE   Staphylococcus aureus POSITIVE (A) NEGATIVE  CBC     Status: Abnormal   Collection Time: 07/07/19  5:47 AM  Result Value Ref Range   WBC 9.8 4.0 - 10.5 K/uL   RBC  3.35 (L) 3.87 - 5.11 MIL/uL   Hemoglobin 10.7 (L) 12.0 - 15.0 g/dL   HCT 32.6 (L) 36.0 - 46.0 %   MCV 97.3 80.0 - 100.0 fL   MCH 31.9 26.0 - 34.0 pg   MCHC 32.8 30.0 - 36.0 g/dL   RDW 14.1 11.5 - 15.5 %   Platelets 134 (L) 150 - 400 K/uL   nRBC 0.0 0.0 - 0.2 %  Basic metabolic panel     Status: Abnormal   Collection Time: 07/07/19  5:47 AM  Result Value Ref Range   Sodium 139 135 - 145 mmol/L   Potassium 3.0 (L) 3.5 - 5.1 mmol/L   Chloride 108 98 - 111 mmol/L   CO2 20 (L) 22 - 32 mmol/L   Glucose, Bld 116 (H) 70 - 99 mg/dL   BUN 27 (H) 8 - 23 mg/dL   Creatinine, Ser 1.35 (H) 0.44 - 1.00 mg/dL   Calcium 8.0 (L) 8.9 - 10.3 mg/dL   GFR calc non Af Amer 40 (L) >60 mL/min   GFR calc Af Amer 46 (L) >60 mL/min   Anion gap 11 5 - 15    CT HIP LEFT W CONTRAST  Result Date: 07/05/2019 CLINICAL DATA:  Fever. Rash. Recent left total hip replacement on 05/17/2019. EXAM: CT OF THE LOWER LEFT  EXTREMITY WITH CONTRAST TECHNIQUE: Multidetector CT imaging of the lower left extremity was performed according to the standard protocol following intravenous contrast administration. COMPARISON:  Radiographs dated 05/17/2019 CONTRAST:  81mL OMNIPAQUE IOHEXOL 300 MG/ML  SOLN FINDINGS: Bones/Joint/Cartilage The components of the left total hip prosthesis appear in excellent position with no evidence of loosening or fracture. Subcortical cysts in the superior aspect of the left acetabulum are not felt to be significant. The visualized pelvic bones are otherwise normal. No appreciable left hip effusion. Muscles and Tendons Normal. Soft tissues There in the long gated rim enhancing fluid collection in the subcutaneous fat at the lateral aspect of the hip at the site of prior incision. This measures 15.5 x 4.0 x 2.2 cm. There is soft tissue stranding in the in the adjacent subcutaneous fat. This fluid collection extends to the muscle fascia at the lateral aspect of the hip. Some detail is obscured by the metallic  artifact caused by the prosthesis. IMPRESSION: Abscess or postoperative seroma in the subcutaneous fat lateral to the left hip along the surgical incision track. Electronically Signed   By: Lorriane Shire M.D.   On: 07/05/2019 20:08   DG FLUORO GUIDED NEEDLE PLC ASPIRATION/INJECTION LOC  Result Date: 07/06/2019 CLINICAL DATA:  Fever, rash, left total hip replaced EXAM: LEFT HIP INJECTION UNDER FLUOROSCOPY FLUOROSCOPY TIME:  Fluoroscopy Time:  30 seconds Radiation Exposure Index (if provided by the fluoroscopic device): 5 mGy PROCEDURE: Overlying skin prepped with Betadine, draped in the usual sterile fashion, and infiltrated locally with buffered Lidocaine. Curved 20 gauge spinal needle advanced to the superolateral margin of the left femoral head with needle felt against the prosthesis. Fluid could not be aspirated despite repositioning. Approximately 3 cc of sterile saline was injected. There was no significant return of fluid upon attempted aspiration. Approximately 5 cc of Omnipaque 300 was injected and needle was felt to be appropriately located in this postoperative patient. No immediate complication. IMPRESSION: Unsuccessful left hip aspiration under fluoroscopy. Electronically Signed   By: Macy Mis M.D.   On: 07/06/2019 16:31    Assessment/Plan: Active Problems:   Cellulitis Left hip cellulitis  - unable to obtain fluid from left hip aspiration yesterday - blood culture grew gram positive cocci and pre-surgical screen positive for A. aureus, ceftriaxone was added   - irrigation and debridement and poly exchange with Dr. Lyla Glassing today.   Contact information:   Weekdays 8-5 [address], [place] [phone] A fter hours and holidays please check [email or web address] for group call information for [facility]  Ventura Bruns 07/07/2019, 7:42 AM

## 2019-07-07 NOTE — Anesthesia Preprocedure Evaluation (Addendum)
Anesthesia Evaluation  Patient identified by MRN, date of birth, ID band Patient awake    Reviewed: Allergy & Precautions, NPO status , Patient's Chart, lab work & pertinent test results  History of Anesthesia Complications Negative for: history of anesthetic complications  Airway Mallampati: II  TM Distance: >3 FB Neck ROM: Full    Dental  (+) Dental Advisory Given, Chipped,    Pulmonary neg pulmonary ROS,    Pulmonary exam normal        Cardiovascular hypertension, Pt. on medications Normal cardiovascular exam     Neuro/Psych negative neurological ROS  negative psych ROS   GI/Hepatic Neg liver ROS, GERD  Medicated and Controlled,  Endo/Other  Morbid obesity Hypokalemia   Renal/GU Renal InsufficiencyRenal disease     Musculoskeletal  (+) Arthritis ,   Abdominal   Peds  Hematology  (+) anemia ,  Thrombocytopenia    Anesthesia Other Findings   Reproductive/Obstetrics                            Anesthesia Physical Anesthesia Plan  ASA: III  Anesthesia Plan: General   Post-op Pain Management:    Induction: Intravenous  PONV Risk Score and Plan: 4 or greater and Treatment may vary due to age or medical condition, Ondansetron and Dexamethasone  Airway Management Planned: Oral ETT  Additional Equipment: None  Intra-op Plan:   Post-operative Plan: Extubation in OR  Informed Consent: I have reviewed the patients History and Physical, chart, labs and discussed the procedure including the risks, benefits and alternatives for the proposed anesthesia with the patient or authorized representative who has indicated his/her understanding and acceptance.     Dental advisory given  Plan Discussed with: CRNA and Anesthesiologist  Anesthesia Plan Comments:       Anesthesia Quick Evaluation

## 2019-07-07 NOTE — Progress Notes (Signed)
Hospitalist progress note   Patient from home, Patient going possibly home, Dispo inpatient at this time  Wendy Ray [id number] DOB: 12-Apr-1951 DOA: 07/05/2019  PCP: Leanna Battles, MD   Narrative:  15 white female HTN HLD reflux  prior right hip replacement 2017-underwent surgery for avascular necrosis of left hip status post T HA 05/16/2018 Return to hospital severe headache fever myalgias 2/23 and found to have left hip pain Orthopedics consulted Also found to have AKI on admission  Data Reviewed:  On admission BUN/creatinine up from baseline 26/1.07-->29/1.7-->29/1.8-->27/1.3 Potassium 3.0 CRP 18 ESR 50 orthopedics WBC 12.4-->14.2 Hemoglobin 12.3-->10.8  Assessment & Plan:  Cellulitis versus infected hardware of hip Blood culture growing group A strep from 2/23-repeat cultures performed 2/25-await the same prior to narrowing antibiotics defer to orthopedics plan duration continue vancomycin and ceftriaxone at this time Continue oxycodone 5 mg every 4 as needed for pain Weightbearing precautions and mobility as per surgeon AKI Likely ATN from  ibuprofen use and underlying use of Hyzaar and may be Mobic-all of been discontinued at this time Hydrate with saline 100 cc/h and monitor-labs are improved Hypokalemia Replace with potassium 40 HTN Continue amlodipine 5 mg daily  Subjective: Doing well no distress just back from surgery about an hour ago states area on leg feels numb Consultants:   Orthopedics  Objective: Vitals:   07/07/19 1500 07/07/19 1515 07/07/19 1528 07/07/19 1537  BP: 105/84 106/63 96/62 111/61  Pulse:  92 (!) 57 88  Resp: 11 (!) 9  18  Temp:  97.7 F (36.5 C) 99 F (37.2 C) 98.1 F (36.7 C)  TempSrc:   Oral Oral  SpO2:  92%  100%  Weight:      Height:        Intake/Output Summary (Last 24 hours) at 07/07/2019 1611 Last data filed at 07/07/2019 1319 Gross per 24 hour  Intake 3043.05 ml  Output 1900 ml  Net 1143.05 ml   Filed Weights   07/05/19 1805  Weight: 109.8 kg    Examination: Awake pleasant no distress EOMI NCAT S1-S2 no murmur abdomen soft obese nontender no rebound Wound VAC in place did not examine  Chest clear  Scheduled Meds: . amLODipine  5 mg Oral Daily  . [START ON 07/08/2019] dexamethasone (DECADRON) injection  10 mg Intravenous Once  . docusate sodium  100 mg Oral BID  . [START ON 07/08/2019] enoxaparin (LOVENOX) injection  40 mg Subcutaneous Q24H  . potassium chloride  40 mEq Oral Daily  . pravastatin  20 mg Oral Daily   Continuous Infusions: . sodium chloride 100 mL/hr at 07/06/19 2244  . sodium chloride    . cefTRIAXone (ROCEPHIN)  IV 2 g (07/06/19 1549)  . methocarbamol (ROBAXIN) IV       LOS: 2 days   Time spent: [place], MD [facility]  07/07/2019, 4:11 PM

## 2019-07-07 NOTE — Consult Note (Signed)
ORTHOPAEDIC CONSULTATION  REQUESTING PHYSICIAN: Nita Sells, MD  PCP:  Leanna Battles, MD  Chief Complaint: Surgical site infection left hip  HPI: Wendy Ray is a 69 y.o. female who complains of left hip pain and erythema for less than 1 week.  She underwent primary left total hip arthroplasty by Dr. Mardelle Matte on 05/17/2019.  She had done well up until this point.  She was admitted to the hospital.  Inflammatory markers were elevated.  Serum white blood cell count was elevated.  She was started on antibiotics.  A CT scan of the left hip was performed showing a fluid collection superficial to the IT band.  Fluoroscopic guided left hip aspiration was performed yesterday by interventional radiology, yielding a dry tap.  Of note, she has had GPC's in a single blood culture.  I was asked by Dr. Mardelle Matte to assist in managing this difficult situation.  Past Medical History:  Diagnosis Date  . Arthritis   . Hypertension   . Primary localized osteoarthritis of right hip 01/22/2016   Past Surgical History:  Procedure Laterality Date  . BACK SURGERY  2003  . CERVICAL DISCECTOMY    . COLONOSCOPY    . JOINT REPLACEMENT    . TOTAL HIP ARTHROPLASTY Right 01/22/2016   Procedure: RIGHT TOTAL HIP ARTHROPLASTY;  Surgeon: Marchia Bond, MD;  Location: [place];  Service: Orthopedics;  Laterality: Right;  . TOTAL HIP ARTHROPLASTY Left 05/17/2019   Procedure: TOTAL HIP ARTHROPLASTY;  Surgeon: Marchia Bond, MD;  Location: [facility];  Service: Orthopedics;  Laterality: Left;   Social History   Socioeconomic History  . Marital status: Married    Spouse name: Not on file  . Number of children: Not on file  . Years of education: Not on file  . Highest education level: Not on file  Occupational History  . Not on file  Tobacco Use  . Smoking status: Never Smoker  . Smokeless tobacco: Never Used  Substance and Sexual Activity  . Alcohol use: Yes    Comment: occasionally  . Drug use: No  .  Sexual activity: Not on file  Other Topics Concern  . Not on file  Social History Narrative  . Not on file   Social Determinants of Health   Financial Resource Strain:   . Difficulty of Paying Living Expenses: Not on file  Food Insecurity:   . Worried About Charity fundraiser in the Last Year: Not on file  . Ran Out of Food in the Last Year: Not on file  Transportation Needs:   . Lack of Transportation (Medical): Not on file  . Lack of Transportation (Non-Medical): Not on file  Physical Activity:   . Days of Exercise per Week: Not on file  . Minutes of Exercise per Session: Not on file  Stress:   . Feeling of Stress : Not on file  Social Connections:   . Frequency of Communication with Friends and Family: Not on file  . Frequency of Social Gatherings with Friends and Family: Not on file  . Attends Religious Services: Not on file  . Active Member of Clubs or Organizations: Not on file  . Attends Archivist Meetings: Not on file  . Marital Status: Not on file   Family History  Problem Relation Age of Onset  . Colon cancer Mother   . Prostate cancer Father   . Colon polyps Sister   . Esophageal cancer Neg Hx   . Rectal cancer Neg  Hx   . Stomach cancer Neg Hx    No Known Allergies Prior to Admission medications   Medication Sig Start Date End Date Taking? Authorizing Provider  acetaminophen (TYLENOL) 500 MG tablet Take 500 mg by mouth every 6 (six) hours as needed for headache.   Yes [provider]  amLODipine (NORVASC) 5 MG tablet Take 5 mg by mouth daily. 02/10/19  Yes [provider]  ibuprofen (ADVIL) 200 MG tablet Take 200 mg by mouth every 6 (six) hours as needed for headache.   Yes [provider]  losartan-hydrochlorothiazide (HYZAAR) 100-12.5 MG tablet Take 1 tablet by mouth daily. 12/31/15  Yes [provider]  Multiple Vitamins-Minerals (ICAPS AREDS 2 PO) Take 2 tablets by mouth daily.   Yes [provider]    pantoprazole (PROTONIX) 20 MG tablet Take 20 mg by mouth daily as needed for heartburn.  07/01/19  Yes [provider]  pravastatin (PRAVACHOL) 20 MG tablet Take 20 mg by mouth daily. 02/09/19  Yes [provider]  aspirin EC 325 MG tablet Take 1 tablet (325 mg total) by mouth 2 (two) times daily. Patient not taking: Reported on 07/05/2019 05/17/19   Ventura Bruns, PA-C  baclofen (LIORESAL) 10 MG tablet Take 1 tablet (10 mg total) by mouth 3 (three) times daily. As needed for muscle spasm Patient not taking: Reported on 07/05/2019 05/17/19   Ventura Bruns, PA-C  meloxicam (MOBIC) 15 MG tablet Take 15 mg by mouth daily as needed for pain.  06/06/19   [provider]  ondansetron (ZOFRAN) 4 MG tablet Take 1 tablet (4 mg total) by mouth every 8 (eight) hours as needed for nausea or vomiting. Patient not taking: Reported on 07/05/2019 05/17/19   Ventura Bruns, PA-C  oxyCODONE (ROXICODONE) 5 MG immediate release tablet Take 1 tablet (5 mg total) by mouth every 4 (four) hours as needed for severe pain. Patient not taking: Reported on 07/05/2019 05/17/19   Merlene Pulling K, PA-C  sennosides-docusate sodium (SENOKOT-S) 8.6-50 MG tablet Take 2 tablets by mouth daily. Patient not taking: Reported on 07/05/2019 05/17/19   Ventura Bruns, PA-C   CT HIP LEFT W CONTRAST  Result Date: 07/05/2019 CLINICAL DATA:  Fever. Rash. Recent left total hip replacement on 05/17/2019. EXAM: CT OF THE LOWER LEFT EXTREMITY WITH CONTRAST TECHNIQUE: Multidetector CT imaging of the lower left extremity was performed according to the standard protocol following intravenous contrast administration. COMPARISON:  Radiographs dated 05/17/2019 CONTRAST:  48mL OMNIPAQUE IOHEXOL 300 MG/ML  SOLN FINDINGS: Bones/Joint/Cartilage The components of the left total hip prosthesis appear in excellent position with no evidence of loosening or fracture. Subcortical cysts in the superior aspect of the left acetabulum are not felt to  be significant. The visualized pelvic bones are otherwise normal. No appreciable left hip effusion. Muscles and Tendons Normal. Soft tissues There in the long gated rim enhancing fluid collection in the subcutaneous fat at the lateral aspect of the hip at the site of prior incision. This measures 15.5 x 4.0 x 2.2 cm. There is soft tissue stranding in the in the adjacent subcutaneous fat. This fluid collection extends to the muscle fascia at the lateral aspect of the hip. Some detail is obscured by the metallic artifact caused by the prosthesis. IMPRESSION: Abscess or postoperative seroma in the subcutaneous fat lateral to the left hip along the surgical incision track. Electronically Signed   By: Lorriane Shire M.D.   On: 07/05/2019 20:08   DG FLUORO  GUIDED NEEDLE PLC ASPIRATION/INJECTION LOC  Result Date: 07/06/2019 CLINICAL DATA:  Fever, rash, left total hip replaced EXAM: LEFT HIP INJECTION UNDER FLUOROSCOPY FLUOROSCOPY TIME:  Fluoroscopy Time:  30 seconds Radiation Exposure Index (if provided by the fluoroscopic device): 5 mGy PROCEDURE: Overlying skin prepped with Betadine, draped in the usual sterile fashion, and infiltrated locally with buffered Lidocaine. Curved 20 gauge spinal needle advanced to the superolateral margin of the left femoral head with needle felt against the prosthesis. Fluid could not be aspirated despite repositioning. Approximately 3 cc of sterile saline was injected. There was no significant return of fluid upon attempted aspiration. Approximately 5 cc of Omnipaque 300 was injected and needle was felt to be appropriately located in this postoperative patient. No immediate complication. IMPRESSION: Unsuccessful left hip aspiration under fluoroscopy. Electronically Signed   By: Macy Mis M.D.   On: 07/06/2019 16:31    Positive ROS: All other systems have been reviewed and were otherwise negative with the exception of those mentioned in the HPI and as above.  Physical  Exam: General: Alert, no acute distress Cardiovascular: No pedal edema Respiratory: No cyanosis, no use of accessory musculature GI: No organomegaly, abdomen is soft and non-tender Skin: No lesions in the area of chief complaint Neurologic: Sensation intact distally Psychiatric: Patient is competent for consent with normal mood and affect Lymphatic: No axillary or cervical lymphadenopathy  MUSCULOSKELETAL: Examination of the left hip shows a healed incision.  She has extensive erythema around the incision, particularly anterior.  This area is tender to palpation.  No significant pain with logrolling of the hip.  She is neurovascular intact.  C-reactive protein elevated at 180 mg/L. Sed rate elevated at 50 mm/h  Assessment: Surgical site infection left hip  Plan: I discussed the findings with the patient.  This is a complicated situation.  She certainly has an infected fluid collection superficial to the IT band.  We discussed operative management.  The CT scan and dry tap suggest that the infection does not involve the prosthesis.  We will plan to explore the fascial layer at the time of surgery.  We discussed not opening the fascia if there is no obvious evidence of infection in order to avoid contamination of her hip implant.  She may require a head ball and liner exchange.  She may require additional surgery in the future.  We discussed the possibility of a two-stage procedure in the future if necessary.  Plan for surgery today.  All questions were solicited and answered.   The risks, benefits, and alternatives were discussed with the patient. There are risks associated with the surgery including, but not limited to, problems with anesthesia (death), infection, instability (giving out of the joint), dislocation, differences in leg length/angulation/rotation, fracture of bones, loosening or failure of implants, hematoma (blood accumulation) which may require surgical drainage, blood clots,  pulmonary embolism, nerve injury (foot drop and lateral thigh numbness), and blood vessel injury. The patient understands these risks and elects to proceed.    Bertram Savin, MD [phone]    07/07/2019 10:43 AM

## 2019-07-07 NOTE — Anesthesia Procedure Notes (Signed)
Procedure Name: Intubation Performed by: Gean Maidens, CRNA Pre-anesthesia Checklist: Patient identified, Emergency Drugs available, Suction available, Patient being monitored and Timeout performed Patient Re-evaluated:Patient Re-evaluated prior to induction Oxygen Delivery Method: Circle system utilized Preoxygenation: Pre-oxygenation with 100% oxygen Induction Type: IV induction Ventilation: Mask ventilation without difficulty Laryngoscope Size: Mac and 4 Grade View: Grade I Tube type: Oral Tube size: 7.0 mm Number of attempts: 1 Airway Equipment and Method: Stylet Placement Confirmation: ETT inserted through vocal cords under direct vision,  positive ETCO2 and breath sounds checked- equal and bilateral Secured at: 21 cm Tube secured with: Tape Dental Injury: Teeth and Oropharynx as per pre-operative assessment

## 2019-07-08 ENCOUNTER — Encounter: Payer: Self-pay | Admitting: *Deleted

## 2019-07-08 ENCOUNTER — Inpatient Hospital Stay: Payer: Self-pay

## 2019-07-08 DIAGNOSIS — B950 Streptococcus, group A, as the cause of diseases classified elsewhere: Secondary | ICD-10-CM

## 2019-07-08 DIAGNOSIS — M00252 Other streptococcal arthritis, left hip: Secondary | ICD-10-CM

## 2019-07-08 DIAGNOSIS — D649 Anemia, unspecified: Secondary | ICD-10-CM

## 2019-07-08 DIAGNOSIS — N179 Acute kidney failure, unspecified: Secondary | ICD-10-CM

## 2019-07-08 DIAGNOSIS — Z978 Presence of other specified devices: Secondary | ICD-10-CM

## 2019-07-08 DIAGNOSIS — I10 Essential (primary) hypertension: Secondary | ICD-10-CM

## 2019-07-08 DIAGNOSIS — T8452XA Infection and inflammatory reaction due to internal left hip prosthesis, initial encounter: Principal | ICD-10-CM

## 2019-07-08 DIAGNOSIS — Z96642 Presence of left artificial hip joint: Secondary | ICD-10-CM

## 2019-07-08 LAB — BASIC METABOLIC PANEL
Anion gap: 6 (ref 5–15)
BUN: 25 mg/dL — ABNORMAL HIGH (ref 8–23)
CO2: 21 mmol/L — ABNORMAL LOW (ref 22–32)
Calcium: 7.8 mg/dL — ABNORMAL LOW (ref 8.9–10.3)
Chloride: 113 mmol/L — ABNORMAL HIGH (ref 98–111)
Creatinine, Ser: 1.05 mg/dL — ABNORMAL HIGH (ref 0.44–1.00)
GFR calc Af Amer: >60 mL/min (ref >60)
GFR calc non Af Amer: 54 mL/min — ABNORMAL LOW (ref >60)
Glucose, Bld: 141 mg/dL — ABNORMAL HIGH (ref 70–99)
Potassium: 4 mmol/L (ref 3.5–5.1)
Sodium: 140 mmol/L (ref 135–145)

## 2019-07-08 LAB — CBC
HCT: 22.6 % — ABNORMAL LOW (ref 36.0–46.0)
HCT: 28.9 % — ABNORMAL LOW (ref 36.0–46.0)
Hemoglobin: 7.1 g/dL — ABNORMAL LOW (ref 12.0–15.0)
Hemoglobin: 9.3 g/dL — ABNORMAL LOW (ref 12.0–15.0)
MCH: 31.4 pg (ref 26.0–34.0)
MCH: 30.6 pg (ref 26.0–34.0)
MCHC: 31.4 g/dL (ref 30.0–36.0)
MCHC: 32.2 g/dL (ref 30.0–36.0)
MCV: 100 fL (ref 80.0–100.0)
MCV: 95.1 fL (ref 80.0–100.0)
Platelets: 120 10*3/uL — ABNORMAL LOW (ref 150–400)
Platelets: 140 10*3/uL — ABNORMAL LOW (ref 150–400)
RBC: 2.26 MIL/uL — ABNORMAL LOW (ref 3.87–5.11)
RBC: 3.04 MIL/uL — ABNORMAL LOW (ref 3.87–5.11)
RDW: 14 % (ref 11.5–15.5)
RDW: 16.2 % — ABNORMAL HIGH (ref 11.5–15.5)
WBC: 8.8 10*3/uL (ref 4.0–10.5)
WBC: 11.2 10*3/uL — ABNORMAL HIGH (ref 4.0–10.5)
nRBC: 0 % (ref 0.0–0.2)
nRBC: 0 % (ref 0.0–0.2)

## 2019-07-08 LAB — PREPARE RBC (CROSSMATCH)

## 2019-07-08 MED ORDER — SODIUM CHLORIDE 0.9% IV SOLUTION: Freq: Once | INTRAVENOUS | Status: AC

## 2019-07-08 MED ORDER — FUROSEMIDE 10 MG/ML IJ SOLN
20.0000 mg | Freq: Once | INTRAMUSCULAR | Status: AC
  Administered 2019-07-08: 14:00:00 20 mg via INTRAVENOUS
  Filled 2019-07-08: qty 2

## 2019-07-08 MED ORDER — ACETAMINOPHEN 325 MG PO TABS
650.0000 mg | ORAL_TABLET | Freq: Once | ORAL | Status: AC
  Administered 2019-07-08: 650 mg via ORAL
  Filled 2019-07-08: qty 2

## 2019-07-08 MED ORDER — VANCOMYCIN HCL 1250 MG/250ML IV SOLN: 1250.0000 mg | INTRAVENOUS | Status: DC

## 2019-07-08 MED ORDER — DIPHENHYDRAMINE HCL 25 MG PO CAPS
25.0000 mg | ORAL_CAPSULE | Freq: Once | ORAL | Status: AC
  Administered 2019-07-08: 25 mg via ORAL
  Filled 2019-07-08: qty 1

## 2019-07-08 MED ORDER — VANCOMYCIN HCL 2000 MG/400ML IV SOLN
2000.0000 mg | Freq: Once | INTRAVENOUS | Status: DC
  Filled 2019-07-08: qty 400

## 2019-07-08 NOTE — Evaluation (Signed)
Physical Therapy Evaluation Patient Details Name: Wendy Ray MRN: [id number] DOB: 01/11/1951 Today's Date: 07/08/2019   History of Present Illness  Pt s/p I&D of L THR with ball and liner exchange.  Pt with hx of bil THR.  Clinical Impression  Pt s/p L THR revision and presents with decreased L LE strength/ROM, post op pain, and posterior THP limiting functional mobility.  Pt should progress to dc home with assist of family.    Follow Up Recommendations Home health PT;Follow surgeon's recommendation for DC plan and follow-up therapies    Equipment Recommendations  None recommended by PT    Recommendations for Other Services OT consult     Precautions / Restrictions Precautions Precautions: Posterior Hip;Fall Precaution Booklet Issued: Yes (comment) Precaution Comments: Precautions reviewed and provided to pt in writing Restrictions Weight Bearing Restrictions: No Other Position/Activity Restrictions: WBAT      Mobility  Bed Mobility Overal bed mobility: Needs Assistance Bed Mobility: Supine to Sit     Supine to sit: Min assist;Mod assist;+2 for physical assistance;+2 for safety/equipment     General bed mobility comments: cues for sequence and adherence to THP; physical assist to manage L LE and to control trunk  Transfers Overall transfer level: Needs assistance Equipment used: Rolling walker (2 wheeled) Transfers: Sit to/from Stand Sit to Stand: Min assist;+2 physical assistance;+2 safety/equipment;From elevated surface         General transfer comment: cues for LE management, adherence to THP, and use of UEs to self assist  Ambulation/Gait Ambulation/Gait assistance: Min assist;+2 safety/equipment Gait Distance (Feet): 10 Feet Assistive device: Rolling walker (2 wheeled) Gait Pattern/deviations: Step-to pattern;Decreased step length - right;Decreased step length - left;Shuffle;Trunk flexed Gait velocity: decr   General Gait Details: cues for posture,  position from RW, sequence and adherence to THP  Stairs            Wheelchair Mobility    Modified Rankin (Stroke Patients Only)       Balance Overall balance assessment: Needs assistance Sitting-balance support: No upper extremity supported;Feet supported Sitting balance-Leahy Scale: Fair     Standing balance support: Bilateral upper extremity supported Standing balance-Leahy Scale: Poor                               Pertinent Vitals/Pain Pain Assessment: 0-10 Pain Score: 2  Pain Location: L hip Pain Descriptors / Indicators: Sore Pain Intervention(s): Limited activity within patient's tolerance;Monitored during session;Premedicated before session    Home Living Family/patient expects to be discharged to:: Private residence Living Arrangements: Alone Available Help at Discharge: Family;Available 24 hours/day Type of Home: House Home Access: Ramped entrance     Home Layout: One level Home Equipment: Walker - 2 wheels;Bedside commode;Tub bench;Grab bars - tub/shower;Grab bars - toilet Additional Comments: Pt will be assisted by "one of my dtrs"    Prior Function Level of Independence: Independent         Comments: No AD prior to onset     Hand Dominance   Dominant Hand: Right    Extremity/Trunk Assessment   Upper Extremity Assessment Upper Extremity Assessment: Overall WFL for tasks assessed    Lower Extremity Assessment Lower Extremity Assessment: LLE deficits/detail LLE Deficits / Details: 2+/5 strength at hip with AAROM at hip to 15 abd and 80 flex       Communication   Communication: No difficulties  Cognition Arousal/Alertness: Awake/alert Behavior During Therapy: WFL for tasks assessed/performed Overall Cognitive  Status: Within Functional Limits for tasks assessed                                        General Comments      Exercises Total Joint Exercises Ankle Circles/Pumps: AROM;Both;15  reps;Supine Quad Sets: AROM;Both;10 reps;Supine Heel Slides: AAROM;Left;10 reps;Supine Hip ABduction/ADduction: AAROM;Left;10 reps;Supine   Assessment/Plan    PT Assessment Patient needs continued PT services  PT Problem List Decreased strength;Decreased range of motion;Decreased activity tolerance;Decreased balance;Decreased mobility;Decreased knowledge of use of DME;Pain;Decreased knowledge of precautions       PT Treatment Interventions DME instruction;Gait training;Functional mobility training;Therapeutic activities;Therapeutic exercise;Balance training;Patient/family education    PT Goals (Current goals can be found in the Care Plan section)  Acute Rehab PT Goals Patient Stated Goal: Regain IND PT Goal Formulation: With patient Time For Goal Achievement: 07/22/19 Potential to Achieve Goals: Good    Frequency 7X/week   Barriers to discharge        Co-evaluation               AM-PAC PT "6 Clicks" Mobility  Outcome Measure Help needed turning from your back to your side while in a flat bed without using bedrails?: A Lot Help needed moving from lying on your back to sitting on the side of a flat bed without using bedrails?: A Lot Help needed moving to and from a bed to a chair (including a wheelchair)?: A Lot Help needed standing up from a chair using your arms (e.g., wheelchair or bedside chair)?: A Lot Help needed to walk in hospital room?: A Lot Help needed climbing 3-5 steps with a railing? : A Lot 6 Click Score: 12    End of Session Equipment Utilized During Treatment: Gait belt Activity Tolerance: Patient tolerated treatment well Patient left: in chair;with call bell/phone within reach;with chair alarm set Nurse Communication: Mobility status PT Visit Diagnosis: Difficulty in walking, not elsewhere classified (R26.2)    Time: [id number] PT Time Calculation (min) (ACUTE ONLY): 32 min   Charges:   PT Evaluation $PT Eval Low Complexity: 1 Low PT  Treatments $Therapeutic Exercise: 8-22 mins        Debe Coder PT Acute Rehabilitation Services Pager [phone] Office [phone]   Wendy Ray 07/08/2019, 11:49 AM

## 2019-07-08 NOTE — Consult Note (Signed)
[facility]    Date of Admission:  07/05/2019   Total days of antibiotics: 3 vanco/ceftriaxone               Reason for Consult: Septic arthritis, Prosthetic Joint Infection   Referring Provider: [place]   Assessment: Prosthetic joint infection- Cx pending BCx- Strep pyogenes I & D with exchange of head ball and liner 07-07-19 HTN Anemia  Plan: 1. Change anbx to ceftriaxone alone 2. Await PIC 3. ESR 50, CRP 18 4. Will have her f/u in ID clinic in next 2-3 weeks  Comment Her BCx is growing Strep pyogenes. Will assume that this is the same organism that is in her hip.  Will continue her on ceftriaxone alone, she could be changed to Penicillin continuous infusion pump, will defer to pt preference (explained to pt and daughter).   Thank you so much for this interesting consult,  Active Problems:   Cellulitis   . amLODipine  5 mg Oral Daily  . enoxaparin (LOVENOX) injection  40 mg Subcutaneous Q24H  . potassium chloride  40 mEq Oral Daily  . pravastatin  20 mg Oral Daily    HPI: Wendy Ray is a 69 y.o. female with hx of HTN and L THR on 05-17-19 due to AVN.  She returned to hospital on 2-23 with increasing hip pain, fevers, headaches and nausea. She noted that her L hip had become red and swollen as well.  In ED was found to have AKI (1.07 --> 1.8) She had CT scan which showed: rim enhancing fluid collection in the subcutaneous fat at the lateral aspect of the hip at the site of prior incision.  This measures 15.5 x 4.0 x 2.2 cm.  There is soft tissue stranding in the adjacent subcutaneous fat.  This fluid collection extends to the muscle fascia at the lateral aspect of the hip.  She was started on vanco/ceftriaxone and underwent exchange of parts on 07-07-19.  Her Op Cx is now growing strep.  Her BCx from adm is Strep pyogenes.   Review of Systems: Review of Systems  Constitutional: Positive for fever.  Respiratory: Negative for  cough and shortness of breath.   Gastrointestinal: Negative for constipation and diarrhea.  Genitourinary: Negative for dysuria.  Musculoskeletal: Positive for joint pain and myalgias.  Please see HPI. All other systems reviewed and negative.   Past Medical History:  Diagnosis Date  . Arthritis   . Hypertension   . Primary localized osteoarthritis of right hip 01/22/2016    Social History   Tobacco Use  . Smoking status: Never Smoker  . Smokeless tobacco: Never Used  Substance Use Topics  . Alcohol use: Yes    Comment: occasionally  . Drug use: No    Family History  Problem Relation Age of Onset  . Colon cancer Mother   . Prostate cancer Father   . Colon polyps Sister   . Esophageal cancer Neg Hx   . Rectal cancer Neg Hx   . Stomach cancer Neg Hx      Medications:  Scheduled: . amLODipine  5 mg Oral Daily  . enoxaparin (LOVENOX) injection  40 mg Subcutaneous Q24H  . potassium chloride  40 mEq Oral Daily  . pravastatin  20 mg Oral Daily    Abtx:  Anti-infectives (From admission, onward)   Start     Dose/Rate Route Frequency Ordered Stop   07/09/19 1400  vancomycin (VANCOREADY) IVPB  1250 mg/250 mL     1,250 mg 166.7 mL/hr over 90 Minutes Intravenous Every 24 hours 07/08/19 1449     07/08/19 1500  vancomycin (VANCOREADY) IVPB 2000 mg/400 mL     2,000 mg 200 mL/hr over 120 Minutes Intravenous  Once 07/08/19 1449     07/07/19 2000  vancomycin (VANCOREADY) IVPB 750 mg/150 mL  Status:  Discontinued     750 mg 150 mL/hr over 60 Minutes Intravenous Every 24 hours 07/07/19 1947 07/07/19 1957   07/06/19 2200  vancomycin (VANCOREADY) IVPB 750 mg/150 mL  Status:  Discontinued     750 mg 150 mL/hr over 60 Minutes Intravenous Every 24 hours 07/06/19 0538 07/06/19 1221   07/06/19 1400  cefTRIAXone (ROCEPHIN) 2 g in sodium chloride 0.9 % 100 mL IVPB     2 g 200 mL/hr over 30 Minutes Intravenous Every 24 hours 07/06/19 1315     07/05/19 2200  vancomycin (VANCOREADY) IVPB  2000 mg/400 mL     2,000 mg 200 mL/hr over 120 Minutes Intravenous Once 07/05/19 2145 07/06/19 0004        OBJECTIVE: Blood pressure 111/63, pulse 80, temperature 97.9 F (36.6 C), temperature source Oral, resp. rate 16, height '5\' 5"'  (1.651 m), weight 109.8 kg, SpO2 98 %.  Physical Exam Constitutional:      Appearance: Normal appearance. She is obese.  HENT:     Mouth/Throat:     Mouth: Mucous membranes are moist.     Pharynx: No oropharyngeal exudate.  Eyes:     Extraocular Movements: Extraocular movements intact.     Pupils: Pupils are equal, round, and reactive to light.  Cardiovascular:     Rate and Rhythm: Normal rate and regular rhythm.  Pulmonary:     Effort: Pulmonary effort is normal.     Breath sounds: Normal breath sounds.  Abdominal:     General: Bowel sounds are normal. There is no distension.     Palpations: Abdomen is soft.     Tenderness: There is no abdominal tenderness.  Musculoskeletal:     Cervical back: Normal range of motion and neck supple.     Right lower leg: No edema.     Left lower leg: No edema.       Legs:  Neurological:     General: No focal deficit present.     Mental Status: She is alert.     Sensory: No sensory deficit.  Psychiatric:        Mood and Affect: Mood normal.     Lab Results Results for orders placed or performed during the hospital encounter of 07/05/19 (from the past 48 hour(s))  CBC     Status: Abnormal   Collection Time: 07/07/19  5:47 AM  Result Value Ref Range   WBC 9.8 4.0 - 10.5 K/uL   RBC 3.35 (L) 3.87 - 5.11 MIL/uL   Hemoglobin 10.7 (L) 12.0 - 15.0 g/dL   HCT 32.6 (L) 36.0 - 46.0 %   MCV 97.3 80.0 - 100.0 fL   MCH 31.9 26.0 - 34.0 pg   MCHC 32.8 30.0 - 36.0 g/dL   RDW 14.1 11.5 - 15.5 %   Platelets 134 (L) 150 - 400 K/uL   nRBC 0.0 0.0 - 0.2 %    Comment: Performed at [facility], [place] [address], [place], [place] [address]  Culture, blood (routine x 2)     Status: None (Preliminary  result)   Collection Time: 07/07/19  5:47 AM  Specimen: BLOOD  Result Value Ref Range   Specimen Description BLOOD RIGHT ARM    Special Requests      BOTTLES DRAWN AEROBIC AND ANAEROBIC Blood Culture adequate volume Performed at [place] [address], [place], [place] [address]    Culture NO GROWTH < 12 HOURS    Report Status PENDING   Culture, blood (routine x 2)     Status: None (Preliminary result)   Collection Time: 07/07/19  5:47 AM   Specimen: BLOOD RIGHT HAND  Result Value Ref Range   Specimen Description BLOOD RIGHT HAND    Special Requests      BOTTLES DRAWN AEROBIC ONLY Blood Culture results may not be optimal due to an inadequate volume of blood received in culture bottles Performed at [facility], [place] [address], [place], [place] [address]    Culture NO GROWTH < 12 HOURS    Report Status PENDING   Basic metabolic panel     Status: Abnormal   Collection Time: 07/07/19  5:47 AM  Result Value Ref Range   Sodium 139 135 - 145 mmol/L   Potassium 3.0 (L) 3.5 - 5.1 mmol/L   Chloride 108 98 - 111 mmol/L   CO2 20 (L) 22 - 32 mmol/L   Glucose, Bld 116 (H) 70 - 99 mg/dL    Comment: Glucose reference range applies only to samples taken after fasting for at least 8 hours.   BUN 27 (H) 8 - 23 mg/dL   Creatinine, Ser 1.35 (H) 0.44 - 1.00 mg/dL   Calcium 8.0 (L) 8.9 - 10.3 mg/dL   GFR calc non Af Amer 40 (L) >60 mL/min   GFR calc Af Amer 46 (L) >60 mL/min   Anion gap 11 5 - 15    Comment: Performed at [facility], [place] [address] [place], [place] [address]  Type and screen Ubly     Status: None (Preliminary result)   Collection Time: 07/07/19 12:15 PM  Result Value Ref Range   ABO/RH(D) A POS    Antibody Screen NEG    Sample Expiration 07/10/2019,2359    Unit Number [id number]    Blood Component Type RED CELLS,LR    Unit division 00    Status of Unit ISSUED    Transfusion Status  OK TO TRANSFUSE    Crossmatch Result Compatible    Unit Number [id number]    Blood Component Type RED CELLS,LR    Unit division 00    Status of Unit ISSUED    Transfusion Status OK TO TRANSFUSE    Crossmatch Result      Compatible Performed at [facility], [place] [address] [place], [place] [address]   ABO/Rh     Status: None   Collection Time: 07/07/19 12:45 PM  Result Value Ref Range   ABO/RH(D)      A POS Performed at [facility], [place] [address] [place], [place] [address]   Aerobic/Anaerobic Culture (surgical/deep wound)     Status: None (Preliminary result)   Collection Time: 07/07/19 12:52 PM   Specimen: PATH Other; Tissue  Result Value Ref Range   Specimen Description      TISSUE LT HIP PSEUDOCAPSULE Performed at [place] [address], [place], [place] [address]    Special Requests      PATIENT ON FOLLOWING ROCEPHIN Performed at [facility], [place] [address] [place],  [address]  Gram Stain      ABUNDANT WBC PRESENT,BOTH PMN AND MONONUCLEAR RARE GRAM POSITIVE COCCI Performed at [facility], [place] [address], [place], [place] [address]    Culture RARE GROUP A STREP (S.PYOGENES) ISOLATED    Report Status PENDING   Aerobic/Anaerobic Culture (surgical/deep wound)     Status: None (Preliminary result)   Collection Time: 07/07/19 12:52 PM   Specimen: PATH Cytology Misc. fluid; Body Fluid  Result Value Ref Range   Specimen Description      TISSUE LT HIP NECROTIC FAT SUBSUTANEOUS Performed at [place] [address] [place], [place] [address]    Special Requests      PATIENT ON FOLLOWING ROCEPHIN Performed at [facility], [place] [address], [place], [place] [address]    Gram Stain      ABUNDANT WBC PRESENT, PREDOMINANTLY PMN NO ORGANISMS SEEN    Culture      NO GROWTH < 24 HOURS Performed at [place]  [address] [place], [place] [address]    Report Status PENDING   Aerobic/Anaerobic Culture (surgical/deep wound)     Status: None (Preliminary result)   Collection Time: 07/07/19 12:52 PM   Specimen: PATH Cytology Misc. fluid; Body Fluid  Result Value Ref Range   Specimen Description      HIP LT SUPERFICIAL COLLECTION Performed at [place] [address] [place], [place] [address]    Special Requests      PATIENT ON FOLLOWING ROCEPHIN Performed at [facility], [place] [address], [place], [place] [address]    Gram Stain      ABUNDANT WBC PRESENT,BOTH PMN AND MONONUCLEAR ABUNDANT GRAM POSITIVE COCCI Performed at [facility], [place] [address], [place], [place] [address]    Culture FEW GROUP A STREP (S.PYOGENES) ISOLATED    Report Status PENDING   CBC     Status: Abnormal   Collection Time: 07/08/19  3:57 AM  Result Value Ref Range   WBC 8.8 4.0 - 10.5 K/uL   RBC 2.26 (L) 3.87 - 5.11 MIL/uL   Hemoglobin 7.1 (L) 12.0 - 15.0 g/dL    Comment: REPEATED TO VERIFY DELTA CHECK NOTED    HCT 22.6 (L) 36.0 - 46.0 %   MCV 100.0 80.0 - 100.0 fL   MCH 31.4 26.0 - 34.0 pg   MCHC 31.4 30.0 - 36.0 g/dL   RDW 14.0 11.5 - 15.5 %   Platelets 120 (L) 150 - 400 K/uL   nRBC 0.0 0.0 - 0.2 %    Comment: Performed at [facility], [place] [address], [place], [place] [address]  Basic metabolic panel     Status: Abnormal   Collection Time: 07/08/19  3:57 AM  Result Value Ref Range   Sodium 140 135 - 145 mmol/L   Potassium 4.0 3.5 - 5.1 mmol/L    Comment: DELTA CHECK NOTED NO VISIBLE HEMOLYSIS    Chloride 113 (H) 98 - 111 mmol/L   CO2 21 (L) 22 - 32 mmol/L   Glucose, Bld 141 (H) 70 - 99 mg/dL    Comment: Glucose reference range applies only to samples taken after fasting for at least 8 hours.   BUN 25 (H) 8 - 23 mg/dL   Creatinine, Ser 1.05 (H) 0.44 - 1.00 mg/dL   Calcium 7.8 (L) 8.9 - 10.3 mg/dL   GFR calc non Af Amer 54 (L) >60 mL/min    GFR calc  Af Amer >60 >60 mL/min   Anion gap 6 5 - 15    Comment: Performed at [facility], [place] [address], [place], [place] [address]  Prepare RBC     Status: None   Collection Time: 07/08/19  7:39 AM  Result Value Ref Range   Order Confirmation      ORDER PROCESSED BY BLOOD BANK Performed at [facility], [place] [address], [place], [place] [address]       Component Value Date/Time   SDES  07/07/2019 1252    TISSUE LT HIP PSEUDOCAPSULE Performed at [facility], [place] [address] [place], [place] [address]    SDES  07/07/2019 1252    TISSUE LT HIP NECROTIC FAT SUBSUTANEOUS Performed at [facility], [place] [address], [place], [place] [address]    SDES  07/07/2019 1252    HIP LT SUPERFICIAL COLLECTION Performed at [facility], [place] [address], [place], [place] [address]    SPECREQUEST  07/07/2019 1252    PATIENT ON FOLLOWING ROCEPHIN Performed at [facility], [place] [address] [place], [place] [address]    SPECREQUEST  07/07/2019 1252    PATIENT ON FOLLOWING ROCEPHIN Performed at [facility], [place] [address], [place], [place] [address]    SPECREQUEST  07/07/2019 1252    PATIENT ON FOLLOWING ROCEPHIN Performed at [facility], [place] [address], [place], [place] [address]    CULT RARE GROUP A STREP (S.PYOGENES) ISOLATED 07/07/2019 1252   CULT  07/07/2019 1252    NO GROWTH < 24 HOURS Performed at [facility], [place] [address] [place], [place] [address]    CULT FEW GROUP A STREP (S.PYOGENES) ISOLATED 07/07/2019 1252   REPTSTATUS PENDING 07/07/2019 1252   REPTSTATUS PENDING 07/07/2019 1252   REPTSTATUS PENDING 07/07/2019 1252   DG Pelvis Portable  Result Date: 07/07/2019 CLINICAL DATA:  Left hip debridement and liner exchange EXAM: PORTABLE PELVIS 1-2 VIEWS COMPARISON:  06/09/2019 FINDINGS: Bilateral total hip arthroplasty  hardware remains in its expected alignment. No periprosthetic lucency or fracture. A surgical drain projects over the left pelvis. Air seen within the soft tissues lateral to the left hip, presumably postoperative. IMPRESSION: Bilateral total hip arthroplasty hardware remains in its expected alignment. Electronically Signed   By: Davina Poke D.O.   On: 07/07/2019 15:36   Recent Results (from the past 240 hour(s))  Culture, blood (single) w Reflex to ID Panel     Status: Abnormal (Preliminary result)   Collection Time: 07/05/19  6:28 PM   Specimen: BLOOD  Result Value Ref Range Status   Specimen Description   Final    BLOOD RIGHT ANTECUBITAL Performed at [place] [address], [place], [place] [address]    Special Requests   Final    BOTTLES DRAWN AEROBIC ONLY Blood Culture results may not be optimal due to an excessive volume of blood received in culture bottles Performed at [place] [address], [place], [place] [address]    Culture  Setup Time   Final    GRAM POSITIVE COCCI IN PAIRS AND CHAINS AEROBIC BOTTLE ONLY CRITICAL RESULT CALLED TO, READ BACK BY AND VERIFIED WITH: D. WOFFORD, PHARMD (WL) AT 1130 ON 07/06/19 BY C. JESSUP, MT. Performed at [facility], [place] [address], [place], [place] [address]    Culture STREPTOCOCCUS PYOGENES (A)  Final   Report Status PENDING  Incomplete  Organism ID, Bacteria STREPTOCOCCUS PYOGENES  Final      Susceptibility   Streptococcus pyogenes - MIC*    PENICILLIN <=0.06 SENSITIVE Sensitive     CEFTRIAXONE <=0.12 SENSITIVE Sensitive     ERYTHROMYCIN <=0.12 SENSITIVE Sensitive     LEVOFLOXACIN <=0.25 SENSITIVE Sensitive     VANCOMYCIN <=0.12 SENSITIVE Sensitive     * STREPTOCOCCUS PYOGENES  Respiratory Panel by RT PCR (Flu A&B, Covid) - Nasopharyngeal Swab     Status: None   Collection Time: 07/05/19 10:05 PM   Specimen: Nasopharyngeal Swab  Result Value Ref Range Status   SARS  Coronavirus 2 by RT PCR NEGATIVE NEGATIVE Final    Comment: (NOTE) SARS-CoV-2 target nucleic acids are NOT DETECTED. The SARS-CoV-2 RNA is generally detectable in upper respiratoy specimens during the acute phase of infection. The lowest concentration of SARS-CoV-2 viral copies this assay can detect is 131 copies/mL. A negative result does not preclude SARS-Cov-2 infection and should not be used as the sole basis for treatment or other patient management decisions. A negative result may occur with  improper specimen collection/handling, submission of specimen other than nasopharyngeal swab, presence of viral mutation(s) within the areas targeted by this assay, and inadequate number of viral copies (<131 copies/mL). A negative result must be combined with clinical observations, patient history, and epidemiological information. The expected result is Negative. Fact Sheet for Patients:  PinkCheek.be Fact Sheet for Healthcare Providers:  GravelBags.it This test is not yet ap proved or cleared by the [place] FDA and  has been authorized for detection and/or diagnosis of SARS-CoV-2 by FDA under an Emergency Use Authorization (EUA). This EUA will remain  in effect (meaning this test can be used) for the duration of the COVID-19 declaration under Section 564(b)(1) of the Act, 21 U.S.C. section 360bbb-3(b)(1), unless the authorization is terminated or revoked sooner.    Influenza A by PCR NEGATIVE NEGATIVE Final   Influenza B by PCR NEGATIVE NEGATIVE Final    Comment: (NOTE) The Xpert Xpress SARS-CoV-2/FLU/RSV assay is intended as an aid in  the diagnosis of influenza from Nasopharyngeal swab specimens and  should not be used as a sole basis for treatment. Nasal washings and  aspirates are unacceptable for Xpert Xpress SARS-CoV-2/FLU/RSV  testing. Fact Sheet for Patients: PinkCheek.be Fact Sheet  for Healthcare Providers: GravelBags.it This test is not yet approved or cleared by the [place] FDA and  has been authorized for detection and/or diagnosis of SARS-CoV-2 by  FDA under an Emergency Use Authorization (EUA). This EUA will remain  in effect (meaning this test can be used) for the duration of the  Covid-19 declaration under Section 564(b)(1) of the Act, 21  U.S.C. section 360bbb-3(b)(1), unless the authorization is  terminated or revoked. Performed at [facility], [place] [address] [place], [place] [address]   Surgical pcr screen     Status: Abnormal   Collection Time: 07/06/19 12:34 PM   Specimen: Nasal Mucosa; Nasal Swab  Result Value Ref Range Status   MRSA, PCR NEGATIVE NEGATIVE Final   Staphylococcus aureus POSITIVE (A) NEGATIVE Final    Comment: (NOTE) The Xpert SA Assay (FDA approved for NASAL specimens in patients 22 years of age and older), is one component of a comprehensive surveillance program. It is not intended to diagnose infection nor to guide or monitor treatment. Performed at [facility], [place] [address] [place], [place] [address]   Culture, blood (routine x 2)     Status: None (  Preliminary result)   Collection Time: 07/07/19  5:47 AM   Specimen: BLOOD  Result Value Ref Range Status   Specimen Description BLOOD RIGHT ARM  Final   Special Requests   Final    BOTTLES DRAWN AEROBIC AND ANAEROBIC Blood Culture adequate volume Performed at [place] [address] [place], [place] [address]    Culture NO GROWTH < 12 HOURS  Final   Report Status PENDING  Incomplete  Culture, blood (routine x 2)     Status: None (Preliminary result)   Collection Time: 07/07/19  5:47 AM   Specimen: BLOOD RIGHT HAND  Result Value Ref Range Status   Specimen Description BLOOD RIGHT HAND  Final   Special Requests   Final    BOTTLES DRAWN AEROBIC ONLY Blood Culture results may  not be optimal due to an inadequate volume of blood received in culture bottles Performed at [facility], [place] [address] [place], [place] [address]    Culture NO GROWTH < 12 HOURS  Final   Report Status PENDING  Incomplete  Aerobic/Anaerobic Culture (surgical/deep wound)     Status: None (Preliminary result)   Collection Time: 07/07/19 12:52 PM   Specimen: PATH Other; Tissue  Result Value Ref Range Status   Specimen Description   Final    TISSUE LT HIP PSEUDOCAPSULE Performed at [place] [address], [place], [place] [address]    Special Requests   Final    PATIENT ON FOLLOWING ROCEPHIN Performed at [facility], [place] [address], [place], [place] [address]    Gram Stain   Final    ABUNDANT WBC PRESENT,BOTH PMN AND MONONUCLEAR RARE GRAM POSITIVE COCCI Performed at [facility], [place] [address] [place], [place] [address]    Culture RARE GROUP A STREP (S.PYOGENES) ISOLATED  Final   Report Status PENDING  Incomplete  Aerobic/Anaerobic Culture (surgical/deep wound)     Status: None (Preliminary result)   Collection Time: 07/07/19 12:52 PM   Specimen: PATH Cytology Misc. fluid; Body Fluid  Result Value Ref Range Status   Specimen Description   Final    TISSUE LT HIP NECROTIC FAT SUBSUTANEOUS Performed at [place] [address], [place], [place] [address]    Special Requests   Final    PATIENT ON FOLLOWING ROCEPHIN Performed at [facility], [place] [address], [place], [place] [address]    Gram Stain   Final    ABUNDANT WBC PRESENT, PREDOMINANTLY PMN NO ORGANISMS SEEN    Culture   Final    NO GROWTH < 24 HOURS Performed at [place] [address] [place], [place] [address]    Report Status PENDING  Incomplete  Aerobic/Anaerobic Culture (surgical/deep wound)     Status: None (Preliminary result)   Collection Time: 07/07/19 12:52 PM   Specimen: PATH  Cytology Misc. fluid; Body Fluid  Result Value Ref Range Status   Specimen Description   Final    HIP LT SUPERFICIAL COLLECTION Performed at [place] [address], [place], [place] [address]    Special Requests   Final    PATIENT ON FOLLOWING ROCEPHIN Performed at [facility], [place] [address] [place], [place] [address]    Gram Stain   Final    ABUNDANT WBC PRESENT,BOTH PMN AND MONONUCLEAR ABUNDANT GRAM POSITIVE COCCI Performed at [facility], [place] [address], [place], [place] [address]  Culture FEW GROUP A STREP (S.PYOGENES) ISOLATED  Final   Report Status PENDING  Incomplete    Microbiology: Recent Results (from the past 240 hour(s))  Culture, blood (single) w Reflex to ID Panel     Status: Abnormal (Preliminary result)   Collection Time: 07/05/19  6:28 PM   Specimen: BLOOD  Result Value Ref Range Status   Specimen Description   Final    BLOOD RIGHT ANTECUBITAL Performed at [facility], [place] [address] [place], [place] [address]    Special Requests   Final    BOTTLES DRAWN AEROBIC ONLY Blood Culture results may not be optimal due to an excessive volume of blood received in culture bottles Performed at [place] [address], [place], [place] [address]    Culture  Setup Time   Final    GRAM POSITIVE COCCI IN PAIRS AND CHAINS AEROBIC BOTTLE ONLY CRITICAL RESULT CALLED TO, READ BACK BY AND VERIFIED WITH: D. WOFFORD, PHARMD (WL) AT 1130 ON 07/06/19 BY C. JESSUP, MT. Performed at [facility], [place] [address] [place], [place] [address]    Culture STREPTOCOCCUS PYOGENES (A)  Final   Report Status PENDING  Incomplete   Organism ID, Bacteria STREPTOCOCCUS PYOGENES  Final      Susceptibility   Streptococcus pyogenes - MIC*    PENICILLIN <=0.06 SENSITIVE Sensitive     CEFTRIAXONE <=0.12 SENSITIVE Sensitive     ERYTHROMYCIN <=0.12 SENSITIVE Sensitive     LEVOFLOXACIN <=0.25  SENSITIVE Sensitive     VANCOMYCIN <=0.12 SENSITIVE Sensitive     * STREPTOCOCCUS PYOGENES  Respiratory Panel by RT PCR (Flu A&B, Covid) - Nasopharyngeal Swab     Status: None   Collection Time: 07/05/19 10:05 PM   Specimen: Nasopharyngeal Swab  Result Value Ref Range Status   SARS Coronavirus 2 by RT PCR NEGATIVE NEGATIVE Final    Comment: (NOTE) SARS-CoV-2 target nucleic acids are NOT DETECTED. The SARS-CoV-2 RNA is generally detectable in upper respiratoy specimens during the acute phase of infection. The lowest concentration of SARS-CoV-2 viral copies this assay can detect is 131 copies/mL. A negative result does not preclude SARS-Cov-2 infection and should not be used as the sole basis for treatment or other patient management decisions. A negative result may occur with  improper specimen collection/handling, submission of specimen other than nasopharyngeal swab, presence of viral mutation(s) within the areas targeted by this assay, and inadequate number of viral copies (<131 copies/mL). A negative result must be combined with clinical observations, patient history, and epidemiological information. The expected result is Negative. Fact Sheet for Patients:  PinkCheek.be Fact Sheet for Healthcare Providers:  GravelBags.it This test is not yet ap proved or cleared by the [place] FDA and  has been authorized for detection and/or diagnosis of SARS-CoV-2 by FDA under an Emergency Use Authorization (EUA). This EUA will remain  in effect (meaning this test can be used) for the duration of the COVID-19 declaration under Section 564(b)(1) of the Act, 21 U.S.C. section 360bbb-3(b)(1), unless the authorization is terminated or revoked sooner.    Influenza A by PCR NEGATIVE NEGATIVE Final   Influenza B by PCR NEGATIVE NEGATIVE Final    Comment: (NOTE) The Xpert Xpress SARS-CoV-2/FLU/RSV assay is intended as an aid in  the  diagnosis of influenza from Nasopharyngeal swab specimens and  should not be used as a sole basis for treatment. Nasal washings and  aspirates are unacceptable for Xpert Xpress SARS-CoV-2/FLU/RSV  testing. Fact Sheet  for Patients: PinkCheek.be Fact Sheet for Healthcare Providers: GravelBags.it This test is not yet approved or cleared by the [place] FDA and  has been authorized for detection and/or diagnosis of SARS-CoV-2 by  FDA under an Emergency Use Authorization (EUA). This EUA will remain  in effect (meaning this test can be used) for the duration of the  Covid-19 declaration under Section 564(b)(1) of the Act, 21  U.S.C. section 360bbb-3(b)(1), unless the authorization is  terminated or revoked. Performed at [facility], [place] [address], [place], [place] [address]   Surgical pcr screen     Status: Abnormal   Collection Time: 07/06/19 12:34 PM   Specimen: Nasal Mucosa; Nasal Swab  Result Value Ref Range Status   MRSA, PCR NEGATIVE NEGATIVE Final   Staphylococcus aureus POSITIVE (A) NEGATIVE Final    Comment: (NOTE) The Xpert SA Assay (FDA approved for NASAL specimens in patients 72 years of age and older), is one component of a comprehensive surveillance program. It is not intended to diagnose infection nor to guide or monitor treatment. Performed at [facility], [place] [address] [place], [place] [address]   Culture, blood (routine x 2)     Status: None (Preliminary result)   Collection Time: 07/07/19  5:47 AM   Specimen: BLOOD  Result Value Ref Range Status   Specimen Description BLOOD RIGHT ARM  Final   Special Requests   Final    BOTTLES DRAWN AEROBIC AND ANAEROBIC Blood Culture adequate volume Performed at [place] [address], [place], [place] [address]    Culture NO GROWTH < 12 HOURS  Final   Report Status PENDING  Incomplete  Culture,  blood (routine x 2)     Status: None (Preliminary result)   Collection Time: 07/07/19  5:47 AM   Specimen: BLOOD RIGHT HAND  Result Value Ref Range Status   Specimen Description BLOOD RIGHT HAND  Final   Special Requests   Final    BOTTLES DRAWN AEROBIC ONLY Blood Culture results may not be optimal due to an inadequate volume of blood received in culture bottles Performed at [facility], [place] [address] [place], [place] [address]    Culture NO GROWTH < 12 HOURS  Final   Report Status PENDING  Incomplete  Aerobic/Anaerobic Culture (surgical/deep wound)     Status: None (Preliminary result)   Collection Time: 07/07/19 12:52 PM   Specimen: PATH Other; Tissue  Result Value Ref Range Status   Specimen Description   Final    TISSUE LT HIP PSEUDOCAPSULE Performed at [place] [address] [place],  [address]    Special Requests   Final    PATIENT ON FOLLOWING ROCEPHIN Performed at [facility], [place] [address], [place], [place] [address]    Gram Stain   Final    ABUNDANT WBC PRESENT,BOTH PMN AND MONONUCLEAR RARE GRAM POSITIVE COCCI Performed at [facility], [place] [address], [place], [place] [address]    Culture RARE GROUP A STREP (S.PYOGENES) ISOLATED  Final   Report Status PENDING  Incomplete  Aerobic/Anaerobic Culture (surgical/deep wound)     Status: None (Preliminary result)   Collection Time: 07/07/19 12:52 PM   Specimen: PATH Cytology Misc. fluid; Body Fluid  Result Value Ref Range Status   Specimen Description   Final    TISSUE LT HIP NECROTIC FAT SUBSUTANEOUS Performed at [place] [address], [place],  [address]  Special Requests   Final    PATIENT ON FOLLOWING ROCEPHIN Performed at [facility], [place] [address], [place], [place] [address]    Gram Stain   Final    ABUNDANT WBC PRESENT, PREDOMINANTLY PMN NO ORGANISMS SEEN    Culture   Final      NO GROWTH < 24 HOURS Performed at [place] [address] [place], [place] [address]    Report Status PENDING  Incomplete  Aerobic/Anaerobic Culture (surgical/deep wound)     Status: None (Preliminary result)   Collection Time: 07/07/19 12:52 PM   Specimen: PATH Cytology Misc. fluid; Body Fluid  Result Value Ref Range Status   Specimen Description   Final    HIP LT SUPERFICIAL COLLECTION Performed at [place] [address], [place], [place] [address]    Special Requests   Final    PATIENT ON FOLLOWING ROCEPHIN Performed at [facility], [place] [address], [place], [place] [address]    Gram Stain   Final    ABUNDANT WBC PRESENT,BOTH PMN AND MONONUCLEAR ABUNDANT GRAM POSITIVE COCCI Performed at [facility], [place] [address], [place], [place] [address]    Culture FEW GROUP A STREP (S.PYOGENES) ISOLATED  Final   Report Status PENDING  Incomplete    Radiographs and labs were personally reviewed by me.     No Known Allergies  OPAT Orders Discharge antibiotics: ceftriaxone 2 g IVPB  Duration: 42 days End Date: 08-16-19  [facility] Care Per Protocol: please  Home health RN for IV administration and teaching; PICC line care and labs.    Labs weekly while on IV antibiotics: _x_ CBC with differential __ BMP _x_ CMP _x_ CRP _x_ ESR __ Vancomycin trough __ CK  _x_ Please pull PIC at completion of IV antibiotics __ Please leave PIC in place until doctor has seen patient or been notified  Fax weekly labs to [phone]  Clinic Follow Up Appt: Rayjon Wery 2-3 weeks.     Bobby Rumpf, MD [facility] [phone] 07/08/2019, 4:02 PM

## 2019-07-08 NOTE — Progress Notes (Signed)
Subjective:  Patient reports pain as mild to moderate.  Denies N/V/CP/SOB. No c/o.  Objective:   VITALS:   Vitals:   07/08/19 1031 07/08/19 1123 07/08/19 1139 07/08/19 1243  BP: 99/60 96/65 99/62  (!) 95/54  Pulse: 67 72 70 76  Resp: 14 14 14 16   Temp: 97.7 F (36.5 C) 97.6 F (36.4 C) 98 F (36.7 C)   TempSrc: Oral Oral Oral   SpO2: 95% 97% 95% 96%  Weight:      Height:       HV 30 cc JP 40 cc   NAD ABD soft Sensation intact distally Intact pulses distally Dorsiflexion/Plantar flexion intact Compartment soft iVAC intact without leak JP ss HV ss  Lab Results  Component Value Date   WBC 8.8 07/08/2019   HGB 7.1 (L) 07/08/2019   HCT 22.6 (L) 07/08/2019   MCV 100.0 07/08/2019   PLT 120 (L) 07/08/2019   BMET    Component Value Date/Time   NA 140 07/08/2019 0357   K 4.0 07/08/2019 0357   CL 113 (H) 07/08/2019 0357   CO2 21 (L) 07/08/2019 0357   GLUCOSE 141 (H) 07/08/2019 0357   BUN 25 (H) 07/08/2019 0357   CREATININE 1.05 (H) 07/08/2019 0357   CALCIUM 7.8 (L) 07/08/2019 0357   GFRNONAA 54 (L) 07/08/2019 0357   GFRAA >60 07/08/2019 0357    Recent Results (from the past 240 hour(s))  Culture, blood (single) w Reflex to ID Panel     Status: Abnormal (Preliminary result)   Collection Time: 07/05/19  6:28 PM   Specimen: BLOOD  Result Value Ref Range Status   Specimen Description   Final    BLOOD RIGHT ANTECUBITAL Performed at [facility], [place] [address] [place], [place] [address]    Special Requests   Final    BOTTLES DRAWN AEROBIC ONLY Blood Culture results may not be optimal due to an excessive volume of blood received in culture bottles Performed at [place] [address], [place], [place] [address]    Culture  Setup Time   Final    GRAM POSITIVE COCCI IN PAIRS AND CHAINS AEROBIC BOTTLE ONLY CRITICAL RESULT CALLED TO, READ BACK BY AND VERIFIED WITH: D. WOFFORD, PHARMD (WL) AT 1130 ON 07/06/19 BY C.  JESSUP, MT. Performed at [facility], [place] [address], [place], [place] [address]    Culture STREPTOCOCCUS PYOGENES (A)  Final   Report Status PENDING  Incomplete   Organism ID, Bacteria STREPTOCOCCUS PYOGENES  Final      Susceptibility   Streptococcus pyogenes - MIC*    PENICILLIN <=0.06 SENSITIVE Sensitive     CEFTRIAXONE <=0.12 SENSITIVE Sensitive     ERYTHROMYCIN <=0.12 SENSITIVE Sensitive     LEVOFLOXACIN <=0.25 SENSITIVE Sensitive     VANCOMYCIN <=0.12 SENSITIVE Sensitive     * STREPTOCOCCUS PYOGENES  Respiratory Panel by RT PCR (Flu A&B, Covid) - Nasopharyngeal Swab     Status: None   Collection Time: 07/05/19 10:05 PM   Specimen: Nasopharyngeal Swab  Result Value Ref Range Status   SARS Coronavirus 2 by RT PCR NEGATIVE NEGATIVE Final    Comment: (NOTE) SARS-CoV-2 target nucleic acids are NOT DETECTED. The SARS-CoV-2 RNA is generally detectable in upper respiratoy specimens during the acute phase of infection. The lowest concentration of SARS-CoV-2 viral copies this assay can detect is 131 copies/mL. A negative result does not preclude SARS-Cov-2 infection and should not be used as the sole basis  for treatment or other patient management decisions. A negative result may occur with  improper specimen collection/handling, submission of specimen other than nasopharyngeal swab, presence of viral mutation(s) within the areas targeted by this assay, and inadequate number of viral copies (<131 copies/mL). A negative result must be combined with clinical observations, patient history, and epidemiological information. The expected result is Negative. Fact Sheet for Patients:  PinkCheek.be Fact Sheet for Healthcare Providers:  GravelBags.it This test is not yet ap proved or cleared by the [place] FDA and  has been authorized for detection and/or diagnosis of SARS-CoV-2 by FDA under an Emergency Use Authorization  (EUA). This EUA will remain  in effect (meaning this test can be used) for the duration of the COVID-19 declaration under Section 564(b)(1) of the Act, 21 U.S.C. section 360bbb-3(b)(1), unless the authorization is terminated or revoked sooner.    Influenza A by PCR NEGATIVE NEGATIVE Final   Influenza B by PCR NEGATIVE NEGATIVE Final    Comment: (NOTE) The Xpert Xpress SARS-CoV-2/FLU/RSV assay is intended as an aid in  the diagnosis of influenza from Nasopharyngeal swab specimens and  should not be used as a sole basis for treatment. Nasal washings and  aspirates are unacceptable for Xpert Xpress SARS-CoV-2/FLU/RSV  testing. Fact Sheet for Patients: PinkCheek.be Fact Sheet for Healthcare Providers: GravelBags.it This test is not yet approved or cleared by the [place] FDA and  has been authorized for detection and/or diagnosis of SARS-CoV-2 by  FDA under an Emergency Use Authorization (EUA). This EUA will remain  in effect (meaning this test can be used) for the duration of the  Covid-19 declaration under Section 564(b)(1) of the Act, 21  U.S.C. section 360bbb-3(b)(1), unless the authorization is  terminated or revoked. Performed at [facility], [place] [address], [place], [place] [address]   Surgical pcr screen     Status: Abnormal   Collection Time: 07/06/19 12:34 PM   Specimen: Nasal Mucosa; Nasal Swab  Result Value Ref Range Status   MRSA, PCR NEGATIVE NEGATIVE Final   Staphylococcus aureus POSITIVE (A) NEGATIVE Final    Comment: (NOTE) The Xpert SA Assay (FDA approved for NASAL specimens in patients 80 years of age and older), is one component of a comprehensive surveillance program. It is not intended to diagnose infection nor to guide or monitor treatment. Performed at [facility], [place] [address], [place], [place] [address]   Culture, blood (routine x 2)     Status: None  (Preliminary result)   Collection Time: 07/07/19  5:47 AM   Specimen: BLOOD  Result Value Ref Range Status   Specimen Description BLOOD RIGHT ARM  Final   Special Requests   Final    BOTTLES DRAWN AEROBIC AND ANAEROBIC Blood Culture adequate volume Performed at [place] [address], [place], [place] [address]    Culture NO GROWTH < 12 HOURS  Final   Report Status PENDING  Incomplete  Culture, blood (routine x 2)     Status: None (Preliminary result)   Collection Time: 07/07/19  5:47 AM   Specimen: BLOOD RIGHT HAND  Result Value Ref Range Status   Specimen Description BLOOD RIGHT HAND  Final   Special Requests   Final    BOTTLES DRAWN AEROBIC ONLY Blood Culture results may not be optimal due to an inadequate volume of blood received in culture bottles Performed at [facility], [place] [address] [place],  [address]    Culture  NO GROWTH < 12 HOURS  Final   Report Status PENDING  Incomplete  Aerobic/Anaerobic Culture (surgical/deep wound)     Status: None (Preliminary result)   Collection Time: 07/07/19 12:52 PM   Specimen: PATH Other; Tissue  Result Value Ref Range Status   Specimen Description   Final    TISSUE LT HIP PSEUDOCAPSULE Performed at [facility], [place] [address], [place], [place] [address]    Special Requests   Final    PATIENT ON FOLLOWING ROCEPHIN Performed at [facility], [place] [address] [place], [place] [address]    Gram Stain   Final    ABUNDANT WBC PRESENT,BOTH PMN AND MONONUCLEAR RARE GRAM POSITIVE COCCI    Culture   Final    CULTURE REINCUBATED FOR BETTER GROWTH Performed at [facility], [place] [address], [place], [place] [address]    Report Status PENDING  Incomplete  Aerobic/Anaerobic Culture (surgical/deep wound)     Status: None (Preliminary result)   Collection Time: 07/07/19 12:52 PM   Specimen: PATH Cytology Misc. fluid; Body Fluid  Result Value Ref  Range Status   Specimen Description   Final    TISSUE LT HIP NECROTIC FAT SUBSUTANEOUS Performed at [place] [address] [place], [place] [address]    Special Requests   Final    PATIENT ON FOLLOWING ROCEPHIN Performed at [facility], [place] [address] [place], [place] [address]    Gram Stain   Final    ABUNDANT WBC PRESENT, PREDOMINANTLY PMN NO ORGANISMS SEEN    Culture   Final    NO GROWTH < 24 HOURS Performed at [place] [address], [place], [place] [address]    Report Status PENDING  Incomplete  Aerobic/Anaerobic Culture (surgical/deep wound)     Status: None (Preliminary result)   Collection Time: 07/07/19 12:52 PM   Specimen: PATH Cytology Misc. fluid; Body Fluid  Result Value Ref Range Status   Specimen Description   Final    HIP LT SUPERFICIAL COLLECTION Performed at [place] [address] [place], [place] [address]    Special Requests   Final    PATIENT ON FOLLOWING ROCEPHIN Performed at [facility], [place] [address], [place], [place] [address]    Gram Stain   Final    ABUNDANT WBC PRESENT,BOTH PMN AND MONONUCLEAR ABUNDANT GRAM POSITIVE COCCI Performed at [facility], [place] [address], [place], [place] [address]    Culture PENDING  Incomplete   Report Status PENDING  Incomplete      Assessment/Plan: 1 Day Post-Op   Active Problems:   Cellulitis  S/p L hip head ball / liner exchange for PJI  WBAT with walker, posterior hip precautions DVT ppx: Lovenox in house --> home on ASA 81 mg PO BID for 6 weeks, SCDs, TEDS PO pain control PT/OT L hip PJI: cont Vanco / Rocephin for now, intraop cultures pending, will need ID consult and PICC Strep pyogenes bacteremia: repeat blood cultures ordered, will need PICC ABLA: eceiving 2 units PRBCs per hospitalist team Dispo: D/C planning, will monitor drain output, plan to d/c home with portable Prevena unit (at  bedside)   Hilton Cork Minyon Billiter 07/08/2019, 1:28 PM   Rod Can, MD [phone] [facility]  [facility] [address], [place] [address], [place],  [address] Phone: [phone] [email or web address] Facebook  [employer]

## 2019-07-08 NOTE — Progress Notes (Signed)
Hospitalist progress note   Patient from home, Patient going possibly home, Dis pending resolution and further clinical stability  Wendy Ray [id number] DOB: 02-24-1951 DOA: 07/05/2019  PCP: Leanna Battles, MD   Narrative:  84 white female HTN HLD reflux  prior right hip replacement 2017-underwent surgery for avascular necrosis of left hip status post T HA 05/16/2018 Return to hospital severe headache fever myalgias 2/23 and found to have left hip pain Orthopedics consulted Also found to have AKI on admission  Data Reviewed:  On admission BUN/creatinine up from baseline 26/1.07-->29/1.7-->29/1.8-->27/1.3 Potassium 3.0 CRP 18 ESR 50 orthopedics WBC 12.4-->14.2 Hemoglobin 12.3-->10.8  Assessment & Plan:  Cellulitis versus infected hardware of hip Blood culture growing group A strep from 2/23-repeat cultures performed 2/25 --intra-op cult also pending--await specuiation prior to decisions re: disposition Continue oxycodone 5 mg every 4 as needed for pain--it seems controlled Weightbearing precautions and mobility as per surgeon AKI Likely ATN from  ibuprofen use and underlying use of Hyzaar and may be Mobic-all of been discontinued at this time Initially given IV saline no saline locked as improved 2/26 Hypokalemia Replace with potassium 40-resolved--montor trends and HTN Continue amlodipine 5 mg daily  Subjective: Patient well no distress ambulated from bed to chair stable with moving around pain is minimal Eating drinking no chest pain no fever  Consultants:   Orthopedics  Objective: Vitals:   07/08/19 0834 07/08/19 1031 07/08/19 1123 07/08/19 1139  BP: 1'00/63 99/60 96/65 ' 99/62  Pulse: 70 67 72 70  Resp: '16 14 14 14  ' Temp: 98 F (36.7 C) 97.7 F (36.5 C) 97.6 F (36.4 C) 98 F (36.7 C)  TempSrc: Oral Oral Oral Oral  SpO2: 95% 95% 97% 95%  Weight:      Height:        Intake/Output Summary (Last 24 hours) at 07/08/2019 1241 Last data filed at 07/08/2019  1136 Gross per 24 hour  Intake 4817.39 ml  Output 1370 ml  Net 3447.39 ml   Filed Weights   07/05/19 1805  Weight: 109.8 kg    Examination: Awake pleasant no distress EOMI NCAT S1-S2 no murmur abdomen soft obese nontender no rebound Wound VAC in place CTA B no added sound No lower extremity edema no rales or rhonchi  Scheduled Meds: . amLODipine  5 mg Oral Daily  . docusate sodium  100 mg Oral BID  . enoxaparin (LOVENOX) injection  40 mg Subcutaneous Q24H  . furosemide  20 mg Intravenous Once  . potassium chloride  40 mEq Oral Daily  . pravastatin  20 mg Oral Daily   Continuous Infusions: . sodium chloride 100 mL/hr at 07/06/19 2244  . sodium chloride 150 mL/hr at 07/08/19 0626  . cefTRIAXone (ROCEPHIN)  IV 2 g (07/06/19 1549)  . methocarbamol (ROBAXIN) IV       LOS: 3 days   Time spent:  [place], MD [facility]  07/08/2019, 12:41 PM

## 2019-07-08 NOTE — Progress Notes (Signed)
PHARMACY CONSULT NOTE FOR:  OUTPATIENT  PARENTERAL ANTIBIOTIC THERAPY (OPAT)  Indication: prosthetic joint infection Regimen: Ceftriaxone 2g IV q24h  End date: 08/16/2019  IV antibiotic discharge orders are pended. To discharging provider:  please sign these orders via discharge navigator,  Select New Orders & click on the button choice - Manage This Unsigned Work.     Thank you for allowing pharmacy to be a part of this patient's care.  Luiz Ochoa 07/08/2019, 4:57 PM

## 2019-07-08 NOTE — Evaluation (Signed)
Occupational Therapy Evaluation Patient Details Name: Wendy Ray MRN: [id number] DOB: [birth year] Today's Date: 07/08/2019    History of Present Illness Pt s/p I&D of L THR with ball and liner exchange.  Pt with hx of bil THR.   Clinical Impression   Pt admitted with the above diagnoses and presents with below problem list. Pt will benefit from continued acute OT to address the below listed deficits and maximize independence with basic ADLs prior to d/c home. At baseline, pt is independent with ADLs. Pt is currently min A with LB ADLs, min A +2 s/e with functional mobility. Daughter present throughout session. Continued education on posterior hip precautions and compensatory strategies      Follow Up Recommendations  Home health OT;Supervision/Assistance - 24 hour    Equipment Recommendations  None recommended by OT    Recommendations for Other Services       Precautions / Restrictions Precautions Precautions: Posterior Hip;Fall Precaution Booklet Issued: Yes (comment) Precaution Comments: Precautions reviewed and provided to pt in writing Restrictions Weight Bearing Restrictions: No Other Position/Activity Restrictions: WBAT      Mobility Bed Mobility Overal bed mobility: Needs Assistance Bed Mobility: Sit to Supine     Supine to sit: Min assist;Mod assist;+2 for physical assistance;+2 for safety/equipment Sit to supine: Mod assist;HOB elevated   General bed mobility comments: assist to advance operated leg onto bed and to steady.   Transfers Overall transfer level: Needs assistance Equipment used: Rolling walker (2 wheeled) Transfers: Sit to/from Stand Sit to Stand: Min assist         General transfer comment: cues for LE management, adherence to THP, and use of UEs to self assist    Balance Overall balance assessment: Needs assistance Sitting-balance support: No upper extremity supported;Feet supported Sitting balance-Leahy Scale: Fair     Standing  balance support: Bilateral upper extremity supported Standing balance-Leahy Scale: Poor                             ADL either performed or assessed with clinical judgement   ADL Overall ADL's : Needs assistance/impaired Eating/Feeding: Set up;Sitting   Grooming: Min guard;Minimal assistance;Standing   Upper Body Bathing: Set up;Sitting   Lower Body Bathing: Minimal assistance;Sit to/from stand   Upper Body Dressing : Set up;Sitting   Lower Body Dressing: Minimal assistance;Sit to/from stand   Toilet Transfer: Minimal assistance;RW;Ambulation(3n1 over toilet')   Toileting- Clothing Manipulation and Hygiene: Minimal assistance;Sit to/from stand;Sitting/lateral lean;Min guard   Tub/ Shower Transfer: Minimal assistance   Functional mobility during ADLs: Minimal assistance;+2 for safety/equipment General ADL Comments: Pt completed household distance functional mobility. Extra time and effort, slow, cautious movements. Min A to steady +2 for safety/chair follow.  Pt then completed toilet transfer and pericare.      Vision         Perception     Praxis      Pertinent Vitals/Pain Pain Assessment: 0-10 Pain Score: 2  Pain Location: L hip Pain Descriptors / Indicators: Sore Pain Intervention(s): Monitored during session;Repositioned;Limited activity within patient's tolerance     Hand Dominance Right   Extremity/Trunk Assessment Upper Extremity Assessment Upper Extremity Assessment: Overall WFL for tasks assessed   Lower Extremity Assessment Lower Extremity Assessment: Defer to PT evaluation LLE Deficits / Details: 2+/5 strength at hip with AAROM at hip to 15 abd and 80 flex       Communication Communication Communication: No difficulties   Cognition Arousal/Alertness:  Awake/alert Behavior During Therapy: WFL for tasks assessed/performed Overall Cognitive Status: Within Functional Limits for tasks assessed                                      General Comments  Daughter present throughout  session    Exercises Exercises: Total Joint Total Joint Exercises Ankle Circles/Pumps: AROM;Both;15 reps;Supine Quad Sets: AROM;Both;10 reps;Supine Heel Slides: AAROM;Left;10 reps;Supine Hip ABduction/ADduction: AAROM;Left;10 reps;Supine   Shoulder Instructions      Home Living Family/patient expects to be discharged to:: Private residence Living Arrangements: Alone Available Help at Discharge: Family;Available 24 hours/day Type of Home: House Home Access: Ramped entrance     Home Layout: One level     Bathroom Shower/Tub: Teacher, early years/pre: Standard Bathroom Accessibility: Yes   Home Equipment: Environmental consultant - 2 wheels;Bedside commode;Tub bench;Grab bars - tub/shower;Grab bars - toilet   Additional Comments: Pt will be assisted by "one of my dtrs"      Prior Functioning/Environment Level of Independence: Independent        Comments: No AD prior to onset        OT Problem List: Impaired balance (sitting and/or standing);Decreased knowledge of use of DME or AE;Decreased knowledge of precautions;Pain      OT Treatment/Interventions: Self-care/ADL training;DME and/or AE instruction;Therapeutic activities;Patient/family education;Balance training    OT Goals(Current goals can be found in the care plan section) Acute Rehab OT Goals Patient Stated Goal: Regain IND OT Goal Formulation: With patient/family Time For Goal Achievement: 07/22/19 Potential to Achieve Goals: Good ADL Goals Pt Will Perform Grooming: with modified independence;standing Pt Will Perform Lower Body Bathing: with modified independence;sit to/from stand Pt Will Perform Lower Body Dressing: with modified independence;sit to/from stand Pt Will Transfer to Toilet: with modified independence;ambulating Pt Will Perform Toileting - Clothing Manipulation and hygiene: with modified independence;sit to/from stand Pt Will Perform Tub/Shower  Transfer: with modified independence;with supervision;ambulating;rolling walker;shower seat  OT Frequency: Min 2X/week   Barriers to D/C:            Co-evaluation PT/OT/SLP Co-Evaluation/Treatment: Yes Reason for Co-Treatment: To address functional/ADL transfers;For patient/therapist safety   OT goals addressed during session: ADL's and self-care      AM-PAC OT "6 Clicks" Daily Activity     Outcome Measure Help from another person eating meals?: None Help from another person taking care of personal grooming?: None Help from another person toileting, which includes using toliet, bedpan, or urinal?: A Little Help from another person bathing (including washing, rinsing, drying)?: A Little Help from another person to put on and taking off regular upper body clothing?: None Help from another person to put on and taking off regular lower body clothing?: A Little 6 Click Score: 21   End of Session Equipment Utilized During Treatment: Rolling walker;Gait belt  Activity Tolerance: Patient tolerated treatment well;Patient limited by fatigue Patient left: in bed;with call bell/phone within reach;with family/visitor present  OT Visit Diagnosis: Unsteadiness on feet (R26.81);Pain                Time: [id number] OT Time Calculation (min): 33 min Charges:  OT General Charges $OT Visit: 1 Visit OT Evaluation $OT Eval Low Complexity: [place], OT Acute Rehabilitation Services Pager: [phone] Office: [phone]   Hortencia Pilar 07/08/2019, 2:22 PM

## 2019-07-08 NOTE — Progress Notes (Signed)
Physical Therapy Treatment Patient Details Name: Wendy Ray MRN: [id number] DOB: [birth year] Today's Date: 07/08/2019    History of Present Illness Pt s/p I&D of L THR with ball and liner exchange.  Pt with hx of bil THR.    PT Comments    Pt progressing well with mobility following 2nd unit of blood.     Follow Up Recommendations  Home health PT;Follow surgeon's recommendation for DC plan and follow-up therapies     Equipment Recommendations  None recommended by PT    Recommendations for Other Services OT consult     Precautions / Restrictions Precautions Precautions: Posterior Hip;Fall Precaution Booklet Issued: Yes (comment) Precaution Comments: Pt recalls 2/3 THP without cues Required Braces or Orthoses: Other Brace Other Brace: THR wedge in place when in bed  Restrictions Weight Bearing Restrictions: No Other Position/Activity Restrictions: WBAT    Mobility  Bed Mobility Overal bed mobility: Needs Assistance Bed Mobility: Supine to Sit     Supine to sit: Min assist;Mod assist Sit to supine: Mod assist;HOB elevated   General bed mobility comments: cues for sequence and use of R LE to self assist.  Physical assist to manage L LE onto bed  Transfers Overall transfer level: Needs assistance Equipment used: Rolling walker (2 wheeled) Transfers: Sit to/from Stand Sit to Stand: Min assist         General transfer comment: cues for LE management, adherence to THP, and use of UEs to self assist  Ambulation/Gait Ambulation/Gait assistance: Min assist Gait Distance (Feet): 70 Feet(and 15' back from bathroom) Assistive device: Rolling walker (2 wheeled) Gait Pattern/deviations: Step-to pattern;Decreased step length - right;Decreased step length - left;Shuffle;Trunk flexed Gait velocity: decr   General Gait Details: cues for posture, position from RW, sequence and adherence to THP   Stairs             Wheelchair Mobility    Modified Rankin  (Stroke Patients Only)       Balance Overall balance assessment: Needs assistance Sitting-balance support: No upper extremity supported;Feet supported Sitting balance-Leahy Scale: Fair     Standing balance support: Bilateral upper extremity supported Standing balance-Leahy Scale: Poor                              Cognition Arousal/Alertness: Awake/alert Behavior During Therapy: WFL for tasks assessed/performed Overall Cognitive Status: Within Functional Limits for tasks assessed                                        Exercises      General Comments General comments (skin integrity, edema, etc.): Daughter present      Pertinent Vitals/Pain Pain Assessment: 0-10 Pain Score: 2  Pain Location: L hip Pain Descriptors / Indicators: Sore Pain Intervention(s): Limited activity within patient's tolerance;Monitored during session;Premedicated before session    Home Living Family/patient expects to be discharged to:: Private residence Living Arrangements: Alone Available Help at Discharge: Family;Available 24 hours/day Type of Home: House Home Access: Ramped entrance   Home Layout: One level Home Equipment: Walker - 2 wheels;Bedside commode;Tub bench;Grab bars - tub/shower;Grab bars - toilet Additional Comments: Pt will be assisted by "one of my dtrs"    Prior Function Level of Independence: Independent      Comments: No AD prior to onset   PT Goals (current goals can now be found in  the care plan section) Acute Rehab PT Goals Patient Stated Goal: Regain IND PT Goal Formulation: With patient Time For Goal Achievement: 07/22/19 Potential to Achieve Goals: Good Progress towards PT goals: Progressing toward goals    Frequency    7X/week      PT Plan Current plan remains appropriate    Co-evaluation PT/OT/SLP Co-Evaluation/Treatment: Yes Reason for Co-Treatment: To address functional/ADL transfers PT goals addressed during session:  Mobility/safety with mobility OT goals addressed during session: ADL's and self-care      AM-PAC PT "6 Clicks" Mobility   Outcome Measure  Help needed turning from your back to your side while in a flat bed without using bedrails?: A Lot Help needed moving from lying on your back to sitting on the side of a flat bed without using bedrails?: A Lot Help needed moving to and from a bed to a chair (including a wheelchair)?: A Lot Help needed standing up from a chair using your arms (e.g., wheelchair or bedside chair)?: A Little Help needed to walk in hospital room?: A Little Help needed climbing 3-5 steps with a railing? : A Lot 6 Click Score: 14    End of Session Equipment Utilized During Treatment: Gait belt Activity Tolerance: Patient tolerated treatment well Patient left: in bed;with call bell/phone within reach;with family/visitor present Nurse Communication: Mobility status PT Visit Diagnosis: Difficulty in walking, not elsewhere classified (R26.2)     Time: 1345-1414 PT Time Calculation (min) (ACUTE ONLY): 29 min  Charges:  $Gait Training: 8-22 mins                     [place] Pager [phone] Office [phone]    Sherriann Szuch 07/08/2019, 3:22 PM

## 2019-07-08 NOTE — Anesthesia Postprocedure Evaluation (Signed)
Anesthesia Post Note  Patient: Wendy Ray  Procedure(s) Performed: DEBRIDEMENT OF LEFT HIP WITH HEAD BALL LINER EXCHANGE (Left Hip)     Patient location during evaluation: PACU Anesthesia Type: General Level of consciousness: awake and alert Pain management: pain level controlled Vital Signs Assessment: post-procedure vital signs reviewed and stable Respiratory status: spontaneous breathing, nonlabored ventilation, respiratory function stable and patient connected to nasal cannula oxygen Cardiovascular status: blood pressure returned to baseline and stable Postop Assessment: no apparent nausea or vomiting Anesthetic complications: no    Last Vitals:  Vitals:   07/08/19 0820 07/08/19 0834  BP: (!) 103/58 100/63  Pulse: 75 70  Resp: 14 16  Temp: 36.8 C 36.7 C  SpO2: 98% 95%    Last Pain:  Vitals:   07/08/19 0834  TempSrc: Oral  PainSc:                  Catalina Gravel

## 2019-07-09 LAB — CBC
HCT: 27.1 % — ABNORMAL LOW (ref 36.0–46.0)
Hemoglobin: 8.9 g/dL — ABNORMAL LOW (ref 12.0–15.0)
MCH: 30.9 pg (ref 26.0–34.0)
MCHC: 32.8 g/dL (ref 30.0–36.0)
MCV: 94.1 fL (ref 80.0–100.0)
Platelets: 141 10*3/uL — ABNORMAL LOW (ref 150–400)
RBC: 2.88 MIL/uL — ABNORMAL LOW (ref 3.87–5.11)
RDW: 16.6 % — ABNORMAL HIGH (ref 11.5–15.5)
WBC: 10.5 10*3/uL (ref 4.0–10.5)
nRBC: 0 % (ref 0.0–0.2)

## 2019-07-09 LAB — TYPE AND SCREEN
ABO/RH(D): A POS
Antibody Screen: NEGATIVE
Unit division: 0
Unit division: 0

## 2019-07-09 LAB — BPAM RBC
Blood Product Expiration Date: 202103212359
Blood Product Expiration Date: 202103212359
ISSUE DATE / TIME: 202102260811
ISSUE DATE / TIME: 202102261045
Unit Type and Rh: 6200
Unit Type and Rh: 6200

## 2019-07-09 LAB — RENAL FUNCTION PANEL
Albumin: 2.5 g/dL — ABNORMAL LOW (ref 3.5–5.0)
Anion gap: 8 (ref 5–15)
BUN: 34 mg/dL — ABNORMAL HIGH (ref 8–23)
CO2: 19 mmol/L — ABNORMAL LOW (ref 22–32)
Calcium: 7.9 mg/dL — ABNORMAL LOW (ref 8.9–10.3)
Chloride: 115 mmol/L — ABNORMAL HIGH (ref 98–111)
Creatinine, Ser: 1.14 mg/dL — ABNORMAL HIGH (ref 0.44–1.00)
GFR calc Af Amer: 57 mL/min — ABNORMAL LOW (ref >60)
GFR calc non Af Amer: 49 mL/min — ABNORMAL LOW (ref >60)
Glucose, Bld: 134 mg/dL — ABNORMAL HIGH (ref 70–99)
Phosphorus: 2.2 mg/dL — ABNORMAL LOW (ref 2.5–4.6)
Potassium: 4.1 mmol/L (ref 3.5–5.1)
Sodium: 142 mmol/L (ref 135–145)

## 2019-07-09 LAB — CULTURE, BLOOD (SINGLE)

## 2019-07-09 MED ORDER — CHLORHEXIDINE GLUCONATE CLOTH 2 % EX PADS
6.0000 | MEDICATED_PAD | Freq: Every day | CUTANEOUS | Status: DC
  Administered 2019-07-10 – 2019-07-11 (×2): 6 via TOPICAL

## 2019-07-09 MED ORDER — SODIUM CHLORIDE 0.9% FLUSH
10.0000 mL | INTRAVENOUS | Status: DC | PRN
  Administered 2019-07-11: 10 mL

## 2019-07-09 MED ORDER — SODIUM CHLORIDE 0.9% FLUSH
10.0000 mL | Freq: Two times a day (BID) | INTRAVENOUS | Status: DC
  Administered 2019-07-11: 10 mL

## 2019-07-09 NOTE — Progress Notes (Signed)
Spoke with Beth RN re PICC order.  Plan on PICC placement today. To notify is d/c home pending today.

## 2019-07-09 NOTE — Progress Notes (Signed)
Physical Therapy Treatment Patient Details Name: Wendy Ray MRN: [id number] DOB: 10-Dec-1950 Today's Date: 07/09/2019    History of Present Illness Pt s/p I&D of L THR with ball and liner exchange.  Pt with hx of bil THR.    PT Comments    Pt progressing slowly and steadily with mobility but with continued intermittent cueing safety and adherence to THP.   Follow Up Recommendations  Home health PT;Follow surgeon's recommendation for DC plan and follow-up therapies     Equipment Recommendations  None recommended by PT    Recommendations for Other Services OT consult     Precautions / Restrictions Precautions Precautions: Posterior Hip;Fall Precaution Booklet Issued: Yes (comment) Precaution Comments: Pt recalls 2/3 THP without cues Required Braces or Orthoses: Other Brace Other Brace: THR wedge in place when in bed  Restrictions Weight Bearing Restrictions: No Other Position/Activity Restrictions: WBAT    Mobility  Bed Mobility Overal bed mobility: Needs Assistance Bed Mobility: Supine to Sit     Supine to sit: Min assist     General bed mobility comments: cues for sequence, adherence to THP and use of R LE to self assist.  Physical assist to manage L LE onto bed  Transfers Overall transfer level: Needs assistance Equipment used: Rolling walker (2 wheeled) Transfers: Sit to/from Stand Sit to Stand: Min assist         General transfer comment: cues for LE management, adherence to THP, and use of UEs to self assist  Ambulation/Gait Ambulation/Gait assistance: Min assist Gait Distance (Feet): 80 Feet Assistive device: Rolling walker (2 wheeled) Gait Pattern/deviations: Step-to pattern;Decreased step length - right;Decreased step length - left;Shuffle;Trunk flexed Gait velocity: decr   General Gait Details: cues for posture, position from RW, sequence, adherence to THP, and to slow pace for safety   Stairs             Wheelchair Mobility     Modified Rankin (Stroke Patients Only)       Balance Overall balance assessment: Needs assistance Sitting-balance support: No upper extremity supported;Feet supported Sitting balance-Leahy Scale: Good     Standing balance support: Bilateral upper extremity supported Standing balance-Leahy Scale: Fair                              Cognition Arousal/Alertness: Awake/alert Behavior During Therapy: WFL for tasks assessed/performed Overall Cognitive Status: Within Functional Limits for tasks assessed                                        Exercises Total Joint Exercises Ankle Circles/Pumps: AROM;Both;15 reps;Supine Quad Sets: AROM;Both;10 reps;Supine Heel Slides: AAROM;Left;10 reps;Supine Hip ABduction/ADduction: AAROM;Left;10 reps;Supine    General Comments        Pertinent Vitals/Pain Pain Assessment: 0-10 Pain Score: 2  Pain Location: L hip Pain Descriptors / Indicators: Sore Pain Intervention(s): Limited activity within patient's tolerance;Monitored during session;Premedicated before session;Ice applied    Home Living                      Prior Function            PT Goals (current goals can now be found in the care plan section) Acute Rehab PT Goals Patient Stated Goal: Regain IND PT Goal Formulation: With patient Time For Goal Achievement: 07/22/19 Potential to Achieve Goals: Good Progress  towards PT goals: Progressing toward goals    Frequency    7X/week      PT Plan Current plan remains appropriate    Co-evaluation              AM-PAC PT "6 Clicks" Mobility   Outcome Measure  Help needed turning from your back to your side while in a flat bed without using bedrails?: A Lot Help needed moving from lying on your back to sitting on the side of a flat bed without using bedrails?: A Little Help needed moving to and from a bed to a chair (including a wheelchair)?: A Little Help needed standing up from a  chair using your arms (e.g., wheelchair or bedside chair)?: A Little Help needed to walk in hospital room?: A Little Help needed climbing 3-5 steps with a railing? : A Lot 6 Click Score: 16    End of Session Equipment Utilized During Treatment: Gait belt Activity Tolerance: Patient tolerated treatment well Patient left: in chair;with call bell/phone within reach;with chair alarm set Nurse Communication: Mobility status PT Visit Diagnosis: Difficulty in walking, not elsewhere classified (R26.2)     Time: [id number] PT Time Calculation (min) (ACUTE ONLY): 36 min  Charges:  $Gait Training: 8-22 mins $Therapeutic Exercise: 8-22 mins                     [place] Pager [phone] Office [phone]    Evellyn Tuff 07/09/2019, 1:19 PM

## 2019-07-09 NOTE — Progress Notes (Signed)
Peripherally Inserted Central Catheter/Midline Placement  The IV Nurse has discussed with the patient and/or persons authorized to consent for the patient, the purpose of this procedure and the potential benefits and risks involved with this procedure.  The benefits include less needle sticks, lab draws from the catheter, and the patient may be discharged home with the catheter. Risks include, but not limited to, infection, bleeding, blood clot (thrombus formation), and puncture of an artery; nerve damage and irregular heartbeat and possibility to perform a PICC exchange if needed/ordered by physician.  Alternatives to this procedure were also discussed.  Bard Power PICC patient education guide, fact sheet on infection prevention and patient information card has been provided to patient /or left at bedside.    PICC/Midline Placement Documentation  PICC Single Lumen 0000000 PICC Right Basilic 38 cm 0 cm (Active)  Indication for Insertion or Continuance of Line Home intravenous therapies (PICC only) 07/09/19 1739  Exposed Catheter (cm) 0 cm 07/09/19 1739  Site Assessment Clean;Dry;Intact 07/09/19 1739  Line Status Flushed;Saline locked;Blood return noted 07/09/19 1739  Dressing Type Transparent 07/09/19 1739  Dressing Status Clean;Dry;Intact;Antimicrobial disc in place 07/09/19 [place] checked and tightened 07/09/19 1739  Line Adjustment (NICU/IV Team Only) No 07/09/19 1739  Dressing Intervention New dressing 07/09/19 1739  Dressing Change Due 07/16/19 07/09/19 1739       Rolena Infante 07/09/2019, 5:39 PM

## 2019-07-09 NOTE — Progress Notes (Signed)
Physical Therapy Treatment Patient Details Name: Wendy Ray MRN: [id number] DOB: Sep 21, 1950 Today's Date: 07/09/2019    History of Present Illness Pt s/p I&D of L THR with ball and liner exchange.  Pt with hx of bil THR.    PT Comments    Pt continues to progress with mobility but requires cues for THR precaution adherence and to slow pace for safety.  Follow Up Recommendations  Home health PT;Follow surgeon's recommendation for DC plan and follow-up therapies     Equipment Recommendations  None recommended by PT    Recommendations for Other Services OT consult     Precautions / Restrictions Precautions Precautions: Posterior Hip;Fall Precaution Booklet Issued: Yes (comment) Precaution Comments: Pt recalls 2/3 THP without cues Required Braces or Orthoses: Other Brace Other Brace: THR wedge in place when in bed  Restrictions Weight Bearing Restrictions: No Other Position/Activity Restrictions: WBAT    Mobility  Bed Mobility Overal bed mobility: Needs Assistance Bed Mobility: Sit to Supine     Supine to sit: Min assist Sit to supine: Min assist   General bed mobility comments: cues for sequence, adherence to THP and use of R LE to self assist.  Physical assist to manage L LE onto bed  Transfers Overall transfer level: Needs assistance Equipment used: Rolling walker (2 wheeled) Transfers: Sit to/from Stand Sit to Stand: Min guard         General transfer comment: cues for LE management, adherence to THP, and use of UEs to self assist  Ambulation/Gait Ambulation/Gait assistance: Min assist;Min guard Gait Distance (Feet): 120 Feet Assistive device: Rolling walker (2 wheeled) Gait Pattern/deviations: Step-to pattern;Decreased step length - right;Decreased step length - left;Shuffle;Trunk flexed Gait velocity: decr   General Gait Details: cues for posture, position from RW, sequence, adherence to THP, and to slow pace for safety   Stairs              Wheelchair Mobility    Modified Rankin (Stroke Patients Only)       Balance Overall balance assessment: Needs assistance Sitting-balance support: No upper extremity supported;Feet supported Sitting balance-Leahy Scale: Good     Standing balance support: Bilateral upper extremity supported Standing balance-Leahy Scale: Fair                              Cognition Arousal/Alertness: Awake/alert Behavior During Therapy: WFL for tasks assessed/performed Overall Cognitive Status: Within Functional Limits for tasks assessed                                        Exercises Total Joint Exercises Ankle Circles/Pumps: AROM;Both;15 reps;Supine Quad Sets: AROM;Both;10 reps;Supine Heel Slides: AAROM;Left;10 reps;Supine Hip ABduction/ADduction: AAROM;Left;10 reps;Supine    General Comments        Pertinent Vitals/Pain Pain Assessment: 0-10 Pain Score: 2  Pain Location: L hip Pain Descriptors / Indicators: Sore Pain Intervention(s): Limited activity within patient's tolerance;Monitored during session;Premedicated before session;Ice applied    Home Living                      Prior Function            PT Goals (current goals can now be found in the care plan section) Acute Rehab PT Goals Patient Stated Goal: Regain IND PT Goal Formulation: With patient Time For Goal Achievement: 07/22/19 Potential  to Achieve Goals: Good Progress towards PT goals: Progressing toward goals    Frequency    7X/week      PT Plan Current plan remains appropriate    Co-evaluation              AM-PAC PT "6 Clicks" Mobility   Outcome Measure  Help needed turning from your back to your side while in a flat bed without using bedrails?: A Lot Help needed moving from lying on your back to sitting on the side of a flat bed without using bedrails?: A Little Help needed moving to and from a bed to a chair (including a wheelchair)?: A  Little Help needed standing up from a chair using your arms (e.g., wheelchair or bedside chair)?: A Little Help needed to walk in hospital room?: A Little Help needed climbing 3-5 steps with a railing? : A Little 6 Click Score: 17    End of Session Equipment Utilized During Treatment: Gait belt Activity Tolerance: Patient tolerated treatment well Patient left: in bed;with call bell/phone within reach Nurse Communication: Mobility status PT Visit Diagnosis: Difficulty in walking, not elsewhere classified (R26.2)     Time: [id number] PT Time Calculation (min) (ACUTE ONLY): 22 min  Charges:  $Gait Training: 8-22 mins $Therapeutic Exercise: 8-22 mins                     Debe Coder PT Acute Rehabilitation Services Pager [phone] Office [phone]    Kamree Wiens 07/09/2019, 3:20 PM

## 2019-07-09 NOTE — Progress Notes (Signed)
Hospitalist progress note   Patient from home, Patient going possibly home, Dis pending resolution and further clinical stability  Wendy Ray [id number] DOB: 1951/03/21 DOA: 07/05/2019  PCP: Leanna Battles, MD   Narrative:  11 white female HTN HLD reflux  prior right hip replacement 2017-underwent surgery for avascular necrosis of left hip status post T HA 05/16/2018 Return to hospital severe headache fever myalgias 2/23 and found to have left hip pain Orthopedics consulted Also found to have AKI on admission  Data Reviewed:  On admission BUN/creatinine up from baseline 26/1.07-->29/1.7-->29/1.8-->27/1.3 Potassium 3.0 CRP 18 ESR 50 orthopedics WBC 12.4-->14.2 Hemoglobin 12.3-->10.8-->7.1-->8.9  Assessment & Plan:  Cellulitis versus infected hardware of hip Blood culture growing group A strep from 2/23-repeat cultures performed 2/25 --intra-op cult also pending--PICC line to be placed later today and likely can discharge a.m. On antibiotics with ceftriaxone versus penicillin if continues to grow group B strep only Continue oxycodone 5 mg every 4 as needed for pain--it seems controlled Weightbearing precautions and mobility as per surgeon AKI Likely ATN from  ibuprofen use and underlying use of Hyzaar and may be Mobic-all of been discontinued at this time Initially given IV saline no saline locked as improved 2/26 Anemia Blood counts dropped from 10-7 on 2/26 therefore transfused 2 units PRBC now stabilizing-monitor trends Hypokalemia Replace with potassium 40-resolved--montor trends and HTN Continue amlodipine 5 mg daily  Subjective:  ambulated 2 doors down No chest pain no fever no chills eating drinking Denies nausea vomiting   Consultants:   Orthopedics  Objective: Vitals:   07/08/19 2037 07/09/19 0601 07/09/19 0916 07/09/19 0949  BP: 121/66 138/74 140/86 115/60  Pulse: 71 67 (!) 104 69  Resp: '16 12 16   ' Temp: 98 F (36.7 C) 97.6 F (36.4 C) 98.5 F (36.9 C)  (!) 97.5 F (36.4 C)  TempSrc: Oral Oral Oral Oral  SpO2: 100% 98% 97% 99%  Weight:      Height:        Intake/Output Summary (Last 24 hours) at 07/09/2019 1359 Last data filed at 07/09/2019 1334 Gross per 24 hour  Intake 360 ml  Output 875 ml  Net -515 ml   Filed Weights   07/05/19 1805  Weight: 109.8 kg    Examination:  Awake pleasant coherent no distress EOMI NCAT Chest clear no added sounds Abdomen soft nontender no rebound no guarding No lower extremity edema Wound and VAC not examined or perturbed Neurologically intact no focal deficit  Scheduled Meds: . amLODipine  5 mg Oral Daily  . enoxaparin (LOVENOX) injection  40 mg Subcutaneous Q24H  . potassium chloride  40 mEq Oral Daily  . pravastatin  20 mg Oral Daily   Continuous Infusions: . cefTRIAXone (ROCEPHIN)  IV 2 g (07/08/19 1432)     LOS: 4 days   Time spent:  Jal, MD [facility]  07/09/2019, 1:59 PM

## 2019-07-09 NOTE — Progress Notes (Addendum)
Subjective:  Patient reports pain as mild.  Denies N/V/CP/SOB. No c/o.  Objective:   VITALS:   Vitals:   07/08/19 1243 07/08/19 1343 07/08/19 2037 07/09/19 0601  BP: (!) 95/54 111/63 121/66 138/74  Pulse: 76 80 71 67  Resp: 16 16 16 12   Temp:  97.9 F (36.6 C) 98 F (36.7 C) 97.6 F (36.4 C)  TempSrc:  Oral Oral Oral  SpO2: 96% 98% 100% 98%  Weight:      Height:       HV 50 cc JP 150 cc   NAD ABD soft Sensation intact distally Intact pulses distally Dorsiflexion/Plantar flexion intact Compartment soft iVAC intact without leak JP ss HV ss  Lab Results  Component Value Date   WBC 10.5 07/09/2019   HGB 8.9 (L) 07/09/2019   HCT 27.1 (L) 07/09/2019   MCV 94.1 07/09/2019   PLT 141 (L) 07/09/2019   BMET    Component Value Date/Time   NA 142 07/09/2019 0348   K 4.1 07/09/2019 0348   CL 115 (H) 07/09/2019 0348   CO2 19 (L) 07/09/2019 0348   GLUCOSE 134 (H) 07/09/2019 0348   BUN 34 (H) 07/09/2019 0348   CREATININE 1.14 (H) 07/09/2019 0348   CALCIUM 7.9 (L) 07/09/2019 0348   GFRNONAA 49 (L) 07/09/2019 0348   GFRAA 57 (L) 07/09/2019 0348    Recent Results (from the past 240 hour(s))  Culture, blood (single) w Reflex to ID Panel     Status: Abnormal (Preliminary result)   Collection Time: 07/05/19  6:28 PM   Specimen: BLOOD  Result Value Ref Range Status   Specimen Description   Final    BLOOD RIGHT ANTECUBITAL Performed at [facility], [place] [address] [place], [place] [address]    Special Requests   Final    BOTTLES DRAWN AEROBIC ONLY Blood Culture results may not be optimal due to an excessive volume of blood received in culture bottles Performed at [place] [address], [place], [place] [address]    Culture  Setup Time   Final    GRAM POSITIVE COCCI IN PAIRS AND CHAINS AEROBIC BOTTLE ONLY CRITICAL RESULT CALLED TO, READ BACK BY AND VERIFIED WITH: D. WOFFORD, PHARMD (WL) AT 1130 ON 07/06/19 BY C.  JESSUP, MT. Performed at [facility], [place] [address] [place], [place] [address]    Culture STREPTOCOCCUS PYOGENES (A)  Final   Report Status PENDING  Incomplete   Organism ID, Bacteria STREPTOCOCCUS PYOGENES  Final      Susceptibility   Streptococcus pyogenes - MIC*    PENICILLIN <=0.06 SENSITIVE Sensitive     CEFTRIAXONE <=0.12 SENSITIVE Sensitive     ERYTHROMYCIN <=0.12 SENSITIVE Sensitive     LEVOFLOXACIN <=0.25 SENSITIVE Sensitive     VANCOMYCIN <=0.12 SENSITIVE Sensitive     * STREPTOCOCCUS PYOGENES  Respiratory Panel by RT PCR (Flu A&B, Covid) - Nasopharyngeal Swab     Status: None   Collection Time: 07/05/19 10:05 PM   Specimen: Nasopharyngeal Swab  Result Value Ref Range Status   SARS Coronavirus 2 by RT PCR NEGATIVE NEGATIVE Final    Comment: (NOTE) SARS-CoV-2 target nucleic acids are NOT DETECTED. The SARS-CoV-2 RNA is generally detectable in upper respiratoy specimens during the acute phase of infection. The lowest concentration of SARS-CoV-2 viral copies this assay can detect is 131 copies/mL. A negative result does not preclude SARS-Cov-2 infection and should not be used as the sole basis for  treatment or other patient management decisions. A negative result may occur with  improper specimen collection/handling, submission of specimen other than nasopharyngeal swab, presence of viral mutation(s) within the areas targeted by this assay, and inadequate number of viral copies (<131 copies/mL). A negative result must be combined with clinical observations, patient history, and epidemiological information. The expected result is Negative. Fact Sheet for Patients:  PinkCheek.be Fact Sheet for Healthcare Providers:  GravelBags.it This test is not yet ap proved or cleared by the [place] FDA and  has been authorized for detection and/or diagnosis of SARS-CoV-2 by FDA under an Emergency Use Authorization  (EUA). This EUA will remain  in effect (meaning this test can be used) for the duration of the COVID-19 declaration under Section 564(b)(1) of the Act, 21 U.S.C. section 360bbb-3(b)(1), unless the authorization is terminated or revoked sooner.    Influenza A by PCR NEGATIVE NEGATIVE Final   Influenza B by PCR NEGATIVE NEGATIVE Final    Comment: (NOTE) The Xpert Xpress SARS-CoV-2/FLU/RSV assay is intended as an aid in  the diagnosis of influenza from Nasopharyngeal swab specimens and  should not be used as a sole basis for treatment. Nasal washings and  aspirates are unacceptable for Xpert Xpress SARS-CoV-2/FLU/RSV  testing. Fact Sheet for Patients: PinkCheek.be Fact Sheet for Healthcare Providers: GravelBags.it This test is not yet approved or cleared by the [place] FDA and  has been authorized for detection and/or diagnosis of SARS-CoV-2 by  FDA under an Emergency Use Authorization (EUA). This EUA will remain  in effect (meaning this test can be used) for the duration of the  Covid-19 declaration under Section 564(b)(1) of the Act, 21  U.S.C. section 360bbb-3(b)(1), unless the authorization is  terminated or revoked. Performed at [facility], [place] [address], [place], [place] [address]   Surgical pcr screen     Status: Abnormal   Collection Time: 07/06/19 12:34 PM   Specimen: Nasal Mucosa; Nasal Swab  Result Value Ref Range Status   MRSA, PCR NEGATIVE NEGATIVE Final   Staphylococcus aureus POSITIVE (A) NEGATIVE Final    Comment: (NOTE) The Xpert SA Assay (FDA approved for NASAL specimens in patients 81 years of age and older), is one component of a comprehensive surveillance program. It is not intended to diagnose infection nor to guide or monitor treatment. Performed at [facility], [place] [address], [place], [place] [address]   Culture, blood (routine x 2)     Status: None  (Preliminary result)   Collection Time: 07/07/19  5:47 AM   Specimen: BLOOD  Result Value Ref Range Status   Specimen Description   Final    BLOOD RIGHT ARM Performed at [place] [address] [place], [place] [address]    Special Requests   Final    BOTTLES DRAWN AEROBIC AND ANAEROBIC Blood Culture adequate volume Performed at [place] [address] [place], [place] [address]    Culture   Final    NO GROWTH 2 DAYS Performed at [place] [address] [place], [place] [address]    Report Status PENDING  Incomplete  Culture, blood (routine x 2)     Status: None (Preliminary result)   Collection Time: 07/07/19  5:47 AM   Specimen: BLOOD RIGHT HAND  Result Value Ref Range Status   Specimen Description   Final    BLOOD RIGHT HAND Performed at [place] [address] [place],  [place] [address]    Special Requests   Final    BOTTLES DRAWN AEROBIC ONLY Blood Culture results may not be optimal due to an inadequate volume of blood received in culture bottles Performed at [place] [address] [place], [place] [address]    Culture   Final    NO GROWTH 2 DAYS Performed at [place] [address] [place], [place] [address]    Report Status PENDING  Incomplete  Aerobic/Anaerobic Culture (surgical/deep wound)     Status: None (Preliminary result)   Collection Time: 07/07/19 12:52 PM   Specimen: PATH Other; Tissue  Result Value Ref Range Status   Specimen Description   Final    TISSUE LT HIP PSEUDOCAPSULE Performed at [place] [address] [place], [place] [address]    Special Requests   Final    PATIENT ON FOLLOWING ROCEPHIN Performed at [facility], [place] [address] [place], [place] [address]    Gram Stain   Final    ABUNDANT WBC PRESENT,BOTH PMN AND MONONUCLEAR RARE GRAM POSITIVE COCCI Performed at [facility], [place] [address], [place], [place] [address]    Culture RARE GROUP A STREP (S.PYOGENES) ISOLATED  Final   Report Status PENDING  Incomplete  Aerobic/Anaerobic Culture (surgical/deep wound)     Status: None (Preliminary result)   Collection Time: 07/07/19 12:52 PM   Specimen: PATH Cytology Misc. fluid; Body Fluid  Result Value Ref Range Status   Specimen Description   Final    TISSUE LT HIP NECROTIC FAT SUBSUTANEOUS Performed at [place] [address] [place], [place] [address]    Special Requests   Final    PATIENT ON FOLLOWING ROCEPHIN Performed at [facility], [place] [address], [place], [place] [address]    Gram Stain   Final    ABUNDANT WBC PRESENT, PREDOMINANTLY PMN NO ORGANISMS SEEN    Culture   Final    NO GROWTH < 24 HOURS Performed at [place] [address], [place], [place] [address]    Report Status PENDING  Incomplete  Aerobic/Anaerobic Culture (surgical/deep wound)     Status: None (Preliminary result)   Collection Time: 07/07/19 12:52 PM   Specimen: PATH Cytology Misc. fluid; Body Fluid  Result Value Ref Range Status   Specimen Description   Final    HIP LT SUPERFICIAL COLLECTION Performed at [place] [address], [place], [place] [address]    Special Requests   Final    PATIENT ON FOLLOWING ROCEPHIN Performed at [facility], [place] [address] [place], [place] [address]    Gram Stain   Final    ABUNDANT WBC PRESENT,BOTH PMN AND MONONUCLEAR ABUNDANT GRAM POSITIVE COCCI Performed at [facility], [place] [address] [place], [place] [address]    Culture FEW GROUP A STREP (S.PYOGENES) ISOLATED  Final   Report Status PENDING  Incomplete      Assessment/Plan: 2 Days Post-Op   Active Problems:   Cellulitis  S/p L hip head ball / liner exchange for PJI  WBAT with walker, posterior hip precautions DVT ppx: Lovenox in house --> home on ASA 81 mg PO  BID for 6 weeks, SCDs, TEDS PO pain control PT/OT L hip PJI: cont Vanco / Rocephin for now, intraop cultures pending, will need PICC.  Maintain drains for now due to increased output Strep pyogenes bacteremia: repeat blood  cultures ordered,  PICC ABLA: stable Dispo: D/C planning, will monitor drain output, plan to d/c home with portable Prevena unit (at bedside)   Nicholes Stairs 07/09/2019, 8:47 AM   [phone] [place] is now [employer] [facility] [address], [place], [place], [place] [address] Phone: [phone] [email or web address] Facebook  [employer]

## 2019-07-10 DIAGNOSIS — Z95828 Presence of other vascular implants and grafts: Secondary | ICD-10-CM

## 2019-07-10 LAB — CBC
HCT: 25.4 % — ABNORMAL LOW (ref 36.0–46.0)
Hemoglobin: 8.2 g/dL — ABNORMAL LOW (ref 12.0–15.0)
MCH: 30.9 pg (ref 26.0–34.0)
MCHC: 32.3 g/dL (ref 30.0–36.0)
MCV: 95.8 fL (ref 80.0–100.0)
Platelets: 166 10*3/uL (ref 150–400)
RBC: 2.65 MIL/uL — ABNORMAL LOW (ref 3.87–5.11)
RDW: 16.3 % — ABNORMAL HIGH (ref 11.5–15.5)
WBC: 7.8 10*3/uL (ref 4.0–10.5)
nRBC: 0.5 % — ABNORMAL HIGH (ref 0.0–0.2)

## 2019-07-10 MED ORDER — GABAPENTIN 300 MG PO CAPS
300.0000 mg | ORAL_CAPSULE | Freq: Three times a day (TID) | ORAL | Status: DC
  Administered 2019-07-10 – 2019-07-11 (×4): 300 mg via ORAL
  Filled 2019-07-10 (×4): qty 1

## 2019-07-10 NOTE — Progress Notes (Signed)
Clarified with Dr. Linus Salmons, end date for OPAT therapy is April 6th. See OPAT completed on 2/26.   Darnelle Bos, PharmD 07/10/19 1:12 PM

## 2019-07-10 NOTE — Progress Notes (Signed)
Hospitalist progress note   Patient from home, Patient going possibly home, Dis pending resolution and further clinical stability  Wendy Ray [id number] DOB: [birth year] DOA: 07/05/2019  PCP: Leanna Battles, MD   Narrative:  53 white female HTN HLD reflux  prior right hip replacement 2017-underwent surgery for avascular necrosis of left hip status post T HA 05/16/2018 Return to hospital severe headache fever myalgias 2/23 and found to have left hip pain Orthopedics consulted Also found to have AKI on admission  Data Reviewed:  On admission BUN/creatinine up from baseline 26/1.07-->29/1.7-->29/1.8-->27/1.3 Potassium 3.0 CRP 18 ESR 50 orthopedics WBC 12.4-->14.2 Hemoglobin 12.3-->10.8-->7.1-->8.9  Assessment & Plan:  Cellulitis versus infected hardware of hip Blood culture growing group A strep from 2/23-repeat cultures performed 2/25 --intra-op cult also pending--PICC placed 2/27 defer to ID if using cephalosporin or penicillin Continue oxycodone 5 mg every 4 as needed for pain--it seems controlled Await wound evaluation from Dr. Lyla Glassing as drainage seems increased and want to make sure that there is no further abscess or other areas of drainage so we will hold on discharge today AKI Likely ATN from  ibuprofen use and underlying use of Hyzaar and may be Mobic-all of been discontinued at this time Recheck labs-saline locked on 2/27 Anemia Blood counts dropped from 10-7 on 2/26 therefore transfused 2 units PRBC now stabilizing-still in the 8 range and may require further replacement in terms of p.o. iron on discharge Hypokalemia Replace with potassium 40-resolved--check labs a.m. HTN Continue amlodipine 5 mg daily  Subjective: Good ambulation seen walking in the hall Seems somewhat depressed this morning and crying No fever no chills no nausea no vomiting   Consultants:   Orthopedics  Objective: Vitals:   07/09/19 1401 07/09/19 1517 07/09/19 2138 07/10/19 0619  BP:  126/62  (!) 152/71 131/76  Pulse: 76  74 61  Resp:   16 16  Temp: 97.9 F (36.6 C)  98.5 F (36.9 C) 98.1 F (36.7 C)  TempSrc: Oral  Oral Oral  SpO2: 100% 98% 98% 99%  Weight:      Height:        Intake/Output Summary (Last 24 hours) at 07/10/2019 1046 Last data filed at 07/10/2019 0924 Gross per 24 hour  Intake 635 ml  Output 2295 ml  Net -1660 ml   Filed Weights   07/05/19 1805  Weight: 109.8 kg    Examination:  EOMI NCAT CTA B S1-S2 no murmur Wound VAC and wound not examined today Neurologically grossly intact  Scheduled Meds: . amLODipine  5 mg Oral Daily  . Chlorhexidine Gluconate Cloth  6 each Topical Daily  . enoxaparin (LOVENOX) injection  40 mg Subcutaneous Q24H  . potassium chloride  40 mEq Oral Daily  . pravastatin  20 mg Oral Daily  . sodium chloride flush  10-40 mL Intracatheter Q12H   Continuous Infusions: . cefTRIAXone (ROCEPHIN)  IV 2 g (07/09/19 1432)     LOS: 5 days   Time spent:  [place], MD [facility]  07/10/2019, 10:46 AM

## 2019-07-10 NOTE — Progress Notes (Signed)
[facility] for Infectious Disease   Reason for visit: Follow up on PJI left hip with ball/liner exchange  Interval History: no associated rash or diarrhea.  WBC wnl, no fever.  No complaints.   Has picc line  Physical Exam: Constitutional:  Vitals:   07/09/19 2138 07/10/19 0619  BP: (!) 152/71 131/76  Pulse: 74 61  Resp: 16 16  Temp: 98.5 F (36.9 C) 98.1 F (36.7 C)  SpO2: 98% 99%   patient appears in NAD, in chair Eyes: anicteric Respiratory: Normal respiratory effort   Review of Systems: Constitutional: negative for fevers and chills Gastrointestinal: negative for nausea and diarrhea Integument/breast: negative for rash  Lab Results  Component Value Date   WBC 7.8 07/10/2019   HGB 8.2 (L) 07/10/2019   HCT 25.4 (L) 07/10/2019   MCV 95.8 07/10/2019   PLT 166 07/10/2019    Lab Results  Component Value Date   CREATININE 1.14 (H) 07/09/2019   BUN 34 (H) 07/09/2019   NA 142 07/09/2019   K 4.1 07/09/2019   CL 115 (H) 07/09/2019   CO2 19 (L) 07/09/2019    Lab Results  Component Value Date   ALT 36 07/05/2019   AST 44 (H) 07/05/2019   ALKPHOS 126 07/05/2019     Microbiology: Recent Results (from the past 240 hour(s))  Culture, blood (single) w Reflex to ID Panel     Status: Abnormal   Collection Time: 07/05/19  6:28 PM   Specimen: BLOOD  Result Value Ref Range Status   Specimen Description   Final    BLOOD RIGHT ANTECUBITAL Performed at [facility], [place] [address] [place], [place] [address]    Special Requests   Final    BOTTLES DRAWN AEROBIC ONLY Blood Culture results may not be optimal due to an excessive volume of blood received in culture bottles Performed at [facility] [address], [place], [place] [address]    Culture  Setup Time   Final    GRAM POSITIVE COCCI IN PAIRS AND CHAINS AEROBIC BOTTLE ONLY CRITICAL RESULT CALLED TO, READ BACK BY AND VERIFIED WITH: D. WOFFORD, PHARMD ([facility]) AT 1130 ON  07/06/19 BY C. JESSUP, MT.    Culture (A)  Final    STREPTOCOCCUS PYOGENES HEALTH DEPARTMENT NOTIFIED Performed at [facility], [place] [address] [place],  [address]    Report Status 07/09/2019 FINAL  Final   Organism ID, Bacteria STREPTOCOCCUS PYOGENES  Final      Susceptibility   Streptococcus pyogenes - MIC*    PENICILLIN <=0.06 SENSITIVE Sensitive     CEFTRIAXONE <=0.12 SENSITIVE Sensitive     ERYTHROMYCIN <=0.12 SENSITIVE Sensitive     LEVOFLOXACIN <=0.25 SENSITIVE Sensitive     VANCOMYCIN <=0.12 SENSITIVE Sensitive     * STREPTOCOCCUS PYOGENES  Respiratory Panel by RT PCR (Flu A&B, Covid) - Nasopharyngeal Swab     Status: None   Collection Time: 07/05/19 10:05 PM   Specimen: Nasopharyngeal Swab  Result Value Ref Range Status   SARS Coronavirus 2 by RT PCR NEGATIVE NEGATIVE Final    Comment: (NOTE) SARS-CoV-2 target nucleic acids are NOT DETECTED. The SARS-CoV-2 RNA is generally detectable in upper respiratoy specimens during the acute phase of infection. The lowest concentration of SARS-CoV-2 viral copies this assay can detect is 131 copies/mL. A negative result does not preclude SARS-Cov-2 infection and should not be used as the sole basis for treatment or other patient management decisions. A negative  result may occur with  improper specimen collection/handling, submission of specimen other than nasopharyngeal swab, presence of viral mutation(s) within the areas targeted by this assay, and inadequate number of viral copies (<131 copies/mL). A negative result must be combined with clinical observations, patient history, and epidemiological information. The expected result is Negative. Fact Sheet for Patients:  PinkCheek.be Fact Sheet for Healthcare Providers:  GravelBags.it This test is not yet ap proved or cleared by the [place] FDA and  has been authorized for detection and/or diagnosis of  SARS-CoV-2 by FDA under an Emergency Use Authorization (EUA). This EUA will remain  in effect (meaning this test can be used) for the duration of the COVID-19 declaration under Section 564(b)(1) of the Act, 21 U.S.C. section 360bbb-3(b)(1), unless the authorization is terminated or revoked sooner.    Influenza A by PCR NEGATIVE NEGATIVE Final   Influenza B by PCR NEGATIVE NEGATIVE Final    Comment: (NOTE) The Xpert Xpress SARS-CoV-2/FLU/RSV assay is intended as an aid in  the diagnosis of influenza from Nasopharyngeal swab specimens and  should not be used as a sole basis for treatment. Nasal washings and  aspirates are unacceptable for Xpert Xpress SARS-CoV-2/FLU/RSV  testing. Fact Sheet for Patients: PinkCheek.be Fact Sheet for Healthcare Providers: GravelBags.it This test is not yet approved or cleared by the [place] FDA and  has been authorized for detection and/or diagnosis of SARS-CoV-2 by  FDA under an Emergency Use Authorization (EUA). This EUA will remain  in effect (meaning this test can be used) for the duration of the  Covid-19 declaration under Section 564(b)(1) of the Act, 21  U.S.C. section 360bbb-3(b)(1), unless the authorization is  terminated or revoked. Performed at [facility], [place] [address] [place], [place] [address]   Surgical pcr screen     Status: Abnormal   Collection Time: 07/06/19 12:34 PM   Specimen: Nasal Mucosa; Nasal Swab  Result Value Ref Range Status   MRSA, PCR NEGATIVE NEGATIVE Final   Staphylococcus aureus POSITIVE (A) NEGATIVE Final    Comment: (NOTE) The Xpert SA Assay (FDA approved for NASAL specimens in patients 52 years of age and older), is one component of a comprehensive surveillance program. It is not intended to diagnose infection nor to guide or monitor treatment. Performed at [facility], [place] [address] [place], [place]  [address]   Culture, blood (routine x 2)     Status: None (Preliminary result)   Collection Time: 07/07/19  5:47 AM   Specimen: BLOOD  Result Value Ref Range Status   Specimen Description   Final    BLOOD RIGHT ARM Performed at [place] [address] [place], [place] [address]    Special Requests   Final    BOTTLES DRAWN AEROBIC AND ANAEROBIC Blood Culture adequate volume Performed at [place] [facility] [address] [place], [place] [address]    Culture   Final    NO GROWTH 3 DAYS Performed at [facility], [place] [address], [place], [place] [address]    Report Status PENDING  Incomplete  Culture, blood (routine x 2)     Status: None (Preliminary result)   Collection Time: 07/07/19  5:47 AM   Specimen: BLOOD RIGHT HAND  Result Value Ref Range Status   Specimen Description   Final    BLOOD RIGHT HAND Performed at [place] [address] [place],  [address]    Special Requests  Final    BOTTLES DRAWN AEROBIC ONLY Blood Culture results may not be optimal due to an inadequate volume of blood received in culture bottles Performed at [facility], [place] [address], [place], [place] [address]    Culture   Final    NO GROWTH 3 DAYS Performed at [facility], [place] [address] [place], [place] [address]    Report Status PENDING  Incomplete  Aerobic/Anaerobic Culture (surgical/deep wound)     Status: None (Preliminary result)   Collection Time: 07/07/19 12:52 PM   Specimen: PATH Other; Tissue  Result Value Ref Range Status   Specimen Description   Final    TISSUE LT HIP PSEUDOCAPSULE Performed at [facility] [address] [place], [place] [address]    Special Requests   Final    PATIENT ON FOLLOWING ROCEPHIN Performed at [facility], [place] [address], [place], [place] [address]    Gram Stain   Final    ABUNDANT WBC PRESENT,BOTH PMN AND  MONONUCLEAR RARE GRAM POSITIVE COCCI Performed at [facility], [place] [address] [place], [place] [address]    Culture   Final    RARE GROUP A STREP (S.PYOGENES) ISOLATED NO ANAEROBES ISOLATED; CULTURE IN PROGRESS FOR 5 DAYS    Report Status PENDING  Incomplete  Aerobic/Anaerobic Culture (surgical/deep wound)     Status: None (Preliminary result)   Collection Time: 07/07/19 12:52 PM   Specimen: PATH Cytology Misc. fluid; Body Fluid  Result Value Ref Range Status   Specimen Description   Final    TISSUE LT HIP NECROTIC FAT SUBSUTANEOUS Performed at [facility] [address] [place], [place] [address]    Special Requests   Final    PATIENT ON FOLLOWING ROCEPHIN Performed at [facility], [place] [address], [place], [place] [address]    Gram Stain   Final    ABUNDANT WBC PRESENT, PREDOMINANTLY PMN NO ORGANISMS SEEN    Culture   Final    NO GROWTH 3 DAYS NO ANAEROBES ISOLATED; CULTURE IN PROGRESS FOR 5 DAYS Performed at [place] [address] [place], [place] [address]    Report Status PENDING  Incomplete  Aerobic/Anaerobic Culture (surgical/deep wound)     Status: None (Preliminary result)   Collection Time: 07/07/19 12:52 PM   Specimen: PATH Cytology Misc. fluid; Body Fluid  Result Value Ref Range Status   Specimen Description   Final    HIP LT SUPERFICIAL COLLECTION Performed at [place] [address], [place], [place] [address]    Special Requests   Final    PATIENT ON FOLLOWING ROCEPHIN Performed at [facility], [place] [address] [place], [place] [address]    Gram Stain   Final    ABUNDANT WBC PRESENT,BOTH PMN AND MONONUCLEAR ABUNDANT GRAM POSITIVE COCCI Performed at [facility], [place] [address], [place], [place] [address]    Culture   Final    FEW GROUP A STREP (S.PYOGENES) ISOLATED NO ANAEROBES ISOLATED; CULTURE IN PROGRESS FOR 5 DAYS    Report Status PENDING   Incomplete    Impression/Plan:  1. PJI left hip s/p ball and liner exchange - plan for 6 weeks of IV ceftriaxone 2 grams daily through April 7th. GAS.   Will consider amoxicillin after that for continuation Baseline CRP 18, ESR 50  2.  HH - labs per home health I will place OPAT consult now  3.  Access - has picc line

## 2019-07-10 NOTE — Progress Notes (Signed)
     Subjective: 3 Days Post-Op Procedure(s) (LRB): DEBRIDEMENT OF LEFT HIP WITH HEAD BALL LINER EXCHANGE (Left)   Patient reports pain as moderate, pain controlled with medication.  We discussed the procedure and expectations moving forward.  Denies any events throughout the night.  Discussed keeping here at least 1 more day to monitor the drainage that she continues to have into her Hemovac units.   Objective:   VITALS:   Vitals:   07/09/19 2138 07/10/19 0619  BP: (!) 152/71 131/76  Pulse: 74 61  Resp: 16 16  Temp: 98.5 F (36.9 C) 98.1 F (36.7 C)  SpO2: 98% 99%    Dorsiflexion/Plantar flexion intact Incision: dressing C/D/I No cellulitis present Compartment soft  LABS Recent Labs    07/08/19 1831 07/09/19 0348 07/10/19 0355  HGB 9.3* 8.9* 8.2*  HCT 28.9* 27.1* 25.4*  WBC 11.2* 10.5 7.8  PLT 140* 141* 166    Recent Labs    07/08/19 0357 07/09/19 0348  NA 140 142  K 4.0 4.1  BUN 25* 34*  CREATININE 1.05* 1.14*  GLUCOSE 141* 134*     Assessment/Plan: 3 Days Post-Op Procedure(s) (LRB): DEBRIDEMENT OF LEFT HIP WITH HEAD BALL LINER EXCHANGE (Left)   Still with a good amount of drainage from her Prevena unit as well as her hemovac and bulb units.  Would suggest keeping until the drainage decreases and/or she can be evaluated by Dr. Lyla Glassing Monday. WBAT with walker, posterior hip precautions DVT ppx: Lovenox in house --> home on ASA 81 mg PO BID for 6 weeks, SCDs, TEDS PO pain control PT/OT Dispo: D/C planning (suggestion of holding until at least Monday), will monitor drain output, plan to d/c home with  with portable Prevena unit (at bedside)   Danae Orleans PA-C  [facility] now [facility]  [place] [address] [place], [place], [place] [address] Phone: [phone] [email or web address] Facebook  [employer]

## 2019-07-10 NOTE — Progress Notes (Signed)
Patient reports "tingling, cold chill pains running down my leg"and is requesting something for nerve pain. Dr Sid Falcon office notified of request.  Answering service will have on call provider return call. Donne Hazel, RN

## 2019-07-10 NOTE — Progress Notes (Signed)
Physical Therapy Treatment Patient Details Name: Wendy Ray MRN: [id number] DOB: 06-01-1950 Today's Date: 07/10/2019    History of Present Illness Pt s/p I&D of L THR with ball and liner exchange.  Pt with hx of bil THR.    PT Comments     Pt continues to progress steadily in performance of all mobility tasks but with continued cues for THP adherence and to slow down for safety.    Follow Up Recommendations  Home health PT;Follow surgeon's recommendation for DC plan and follow-up therapies     Equipment Recommendations  None recommended by PT    Recommendations for Other Services OT consult     Precautions / Restrictions Precautions Precautions: Posterior Hip;Fall Precaution Booklet Issued: Yes (comment) Precaution Comments: Pt recalls 3/3 THP without cues Required Braces or Orthoses: Other Brace Other Brace: THR wedge in place when in bed  Restrictions Weight Bearing Restrictions: No Other Position/Activity Restrictions: WBAT    Mobility  Bed Mobility Overal bed mobility: Needs Assistance Bed Mobility: Sit to Supine     Supine to sit: Min guard Sit to supine: Min assist   General bed mobility comments: cues for sequence, adherence to THP and use of R LE to self assist.  Min assist to manage L LE onto bed safely  Transfers Overall transfer level: Needs assistance Equipment used: Rolling walker (2 wheeled) Transfers: Sit to/from Stand Sit to Stand: Min guard;Supervision         General transfer comment: cues for LE management, adherence to THP, and use of UEs to self assist  Ambulation/Gait Ambulation/Gait assistance: Min guard;Supervision Gait Distance (Feet): 180 Feet Assistive device: Rolling walker (2 wheeled) Gait Pattern/deviations: Step-to pattern;Decreased step length - right;Decreased step length - left;Shuffle;Trunk flexed Gait velocity: decr   General Gait Details: cues for posture, position from RW, sequence, adherence to THP, and to slow  pace for safety   Stairs             Wheelchair Mobility    Modified Rankin (Stroke Patients Only)       Balance Overall balance assessment: Needs assistance Sitting-balance support: No upper extremity supported;Feet supported Sitting balance-Leahy Scale: Good     Standing balance support: Bilateral upper extremity supported Standing balance-Leahy Scale: Fair                              Cognition Arousal/Alertness: Awake/alert Behavior During Therapy: WFL for tasks assessed/performed Overall Cognitive Status: Within Functional Limits for tasks assessed                                        Exercises Total Joint Exercises Ankle Circles/Pumps: AROM;Both;15 reps;Supine Quad Sets: AROM;Both;10 reps;Supine Heel Slides: AAROM;Left;Supine;15 reps Hip ABduction/ADduction: AAROM;Left;Supine;15 reps Long Arc Quad: AROM;Left;15 reps;Seated    General Comments        Pertinent Vitals/Pain Pain Assessment: 0-10 Pain Score: 3  Pain Location: L hip Pain Descriptors / Indicators: Sore Pain Intervention(s): Monitored during session;Limited activity within patient's tolerance    Home Living                      Prior Function            PT Goals (current goals can now be found in the care plan section) Acute Rehab PT Goals Patient Stated Goal: Regain IND PT  Goal Formulation: With patient Time For Goal Achievement: 07/22/19 Potential to Achieve Goals: Good Progress towards PT goals: Progressing toward goals    Frequency    7X/week      PT Plan Current plan remains appropriate    Co-evaluation              AM-PAC PT "6 Clicks" Mobility   Outcome Measure  Help needed turning from your back to your side while in a flat bed without using bedrails?: A Lot Help needed moving from lying on your back to sitting on the side of a flat bed without using bedrails?: A Little Help needed moving to and from a bed to a  chair (including a wheelchair)?: A Little Help needed standing up from a chair using your arms (e.g., wheelchair or bedside chair)?: A Little Help needed to walk in hospital room?: A Little Help needed climbing 3-5 steps with a railing? : A Little 6 Click Score: 17    End of Session Equipment Utilized During Treatment: Gait belt Activity Tolerance: Patient tolerated treatment well Patient left: in bed;in CPM;with bed alarm set;Other (comment)(foam wedge in place) Nurse Communication: Mobility status PT Visit Diagnosis: Difficulty in walking, not elsewhere classified (R26.2)     Time: [id number] PT Time Calculation (min) (ACUTE ONLY): 20 min  Charges:  $Gait Training: 8-22 mins $Therapeutic Exercise: 8-22 mins                     [place] Pager [phone] Office [phone]    Artin Mceuen 07/10/2019, 6:03 PM

## 2019-07-10 NOTE — Progress Notes (Signed)
Physical Therapy Treatment Patient Details Name: Wendy Ray MRN: [id number] DOB: June 25, 1950 Today's Date: 07/10/2019    History of Present Illness Pt s/p I&D of L THR with ball and liner exchange.  Pt with hx of bil THR.    PT Comments    Pt continues cooperative but disappointed in having to stay additional day.  Pt continues to progress steadily in performance of all mobility tasks but with continued cues for THP adherence and to slow down for safety.   Follow Up Recommendations  Home health PT;Follow surgeon's recommendation for DC plan and follow-up therapies     Equipment Recommendations  None recommended by PT    Recommendations for Other Services OT consult     Precautions / Restrictions Precautions Precautions: Posterior Hip;Fall Precaution Booklet Issued: Yes (comment) Precaution Comments: Pt recalls 3/3 THP without cues Required Braces or Orthoses: Other Brace Other Brace: THR wedge in place when in bed  Restrictions Weight Bearing Restrictions: No Other Position/Activity Restrictions: WBAT    Mobility  Bed Mobility Overal bed mobility: Needs Assistance Bed Mobility: Supine to Sit     Supine to sit: Min guard     General bed mobility comments: cues for sequence, adherence to THP and use of R LE to self assist.    Transfers Overall transfer level: Needs assistance Equipment used: Rolling walker (2 wheeled) Transfers: Sit to/from Stand Sit to Stand: Min guard         General transfer comment: cues for LE management, adherence to THP, and use of UEs to self assist  Ambulation/Gait Ambulation/Gait assistance: Min guard Gait Distance (Feet): 100 Feet(twice) Assistive device: Rolling walker (2 wheeled) Gait Pattern/deviations: Step-to pattern;Decreased step length - right;Decreased step length - left;Shuffle;Trunk flexed Gait velocity: decr   General Gait Details: cues for posture, position from RW, sequence, adherence to THP, and to slow pace  for safety   Stairs             Wheelchair Mobility    Modified Rankin (Stroke Patients Only)       Balance Overall balance assessment: Needs assistance Sitting-balance support: No upper extremity supported;Feet supported Sitting balance-Leahy Scale: Good     Standing balance support: Bilateral upper extremity supported Standing balance-Leahy Scale: Fair                              Cognition Arousal/Alertness: Awake/alert Behavior During Therapy: WFL for tasks assessed/performed Overall Cognitive Status: Within Functional Limits for tasks assessed                                        Exercises Total Joint Exercises Ankle Circles/Pumps: AROM;Both;15 reps;Supine Quad Sets: AROM;Both;10 reps;Supine Heel Slides: AAROM;Left;Supine;15 reps Hip ABduction/ADduction: AAROM;Left;Supine;15 reps Long Arc Quad: AROM;Left;15 reps;Seated    General Comments        Pertinent Vitals/Pain Pain Assessment: 0-10 Pain Score: 3  Pain Location: L hip Pain Descriptors / Indicators: Sore Pain Intervention(s): Limited activity within patient's tolerance;Monitored during session;Ice applied    Home Living                      Prior Function            PT Goals (current goals can now be found in the care plan section) Acute Rehab PT Goals Patient Stated Goal: Regain IND PT  Goal Formulation: With patient Time For Goal Achievement: 07/22/19 Potential to Achieve Goals: Good Progress towards PT goals: Progressing toward goals    Frequency    7X/week      PT Plan Current plan remains appropriate    Co-evaluation              AM-PAC PT "6 Clicks" Mobility   Outcome Measure  Help needed turning from your back to your side while in a flat bed without using bedrails?: A Lot Help needed moving from lying on your back to sitting on the side of a flat bed without using bedrails?: A Little Help needed moving to and from a bed  to a chair (including a wheelchair)?: A Little Help needed standing up from a chair using your arms (e.g., wheelchair or bedside chair)?: A Little Help needed to walk in hospital room?: A Little Help needed climbing 3-5 steps with a railing? : A Little 6 Click Score: 17    End of Session Equipment Utilized During Treatment: Gait belt Activity Tolerance: Patient tolerated treatment well Patient left: in chair;with call bell/phone within reach;with chair alarm set Nurse Communication: Mobility status PT Visit Diagnosis: Difficulty in walking, not elsewhere classified (R26.2)     Time: [id number] PT Time Calculation (min) (ACUTE ONLY): 26 min  Charges:  $Gait Training: 8-22 mins $Therapeutic Exercise: 8-22 mins                     [place] Pager [phone] Office [phone]    Arcangel Minion 07/10/2019, 5:57 PM

## 2019-07-11 ENCOUNTER — Encounter (HOSPITAL_COMMUNITY): Payer: Self-pay | Admitting: Internal Medicine

## 2019-07-11 ENCOUNTER — Telehealth: Payer: Self-pay | Admitting: Infectious Diseases

## 2019-07-11 DIAGNOSIS — T8452XA Infection and inflammatory reaction due to internal left hip prosthesis, initial encounter: Secondary | ICD-10-CM

## 2019-07-11 HISTORY — DX: Infection and inflammatory reaction due to internal left hip prosthesis, initial encounter: T84.52XA

## 2019-07-11 LAB — BASIC METABOLIC PANEL
Anion gap: 8 (ref 5–15)
BUN: 18 mg/dL (ref 8–23)
CO2: 23 mmol/L (ref 22–32)
Calcium: 8.1 mg/dL — ABNORMAL LOW (ref 8.9–10.3)
Chloride: 109 mmol/L (ref 98–111)
Creatinine, Ser: 0.75 mg/dL (ref 0.44–1.00)
GFR calc Af Amer: >60 mL/min (ref >60)
GFR calc non Af Amer: >60 mL/min (ref >60)
Glucose, Bld: 80 mg/dL (ref 70–99)
Potassium: 4.1 mmol/L (ref 3.5–5.1)
Sodium: 140 mmol/L (ref 135–145)

## 2019-07-11 LAB — CBC WITH DIFFERENTIAL/PLATELET
Abs Immature Granulocytes: 0.45 10*3/uL — ABNORMAL HIGH (ref 0.00–0.07)
Basophils Absolute: 0 10*3/uL (ref 0.0–0.1)
Basophils Relative: 0 %
Eosinophils Absolute: 0.1 10*3/uL (ref 0.0–0.5)
Eosinophils Relative: 1 %
HCT: 27 % — ABNORMAL LOW (ref 36.0–46.0)
Hemoglobin: 8.5 g/dL — ABNORMAL LOW (ref 12.0–15.0)
Immature Granulocytes: 5 %
Lymphocytes Relative: 36 %
Lymphs Abs: 3.5 10*3/uL (ref 0.7–4.0)
MCH: 30.2 pg (ref 26.0–34.0)
MCHC: 31.5 g/dL (ref 30.0–36.0)
MCV: 96.1 fL (ref 80.0–100.0)
Monocytes Absolute: 0.8 10*3/uL (ref 0.1–1.0)
Monocytes Relative: 8 %
Neutro Abs: 4.9 10*3/uL (ref 1.7–7.7)
Neutrophils Relative %: 50 %
Platelets: 196 10*3/uL (ref 150–400)
RBC: 2.81 MIL/uL — ABNORMAL LOW (ref 3.87–5.11)
RDW: 15.5 % (ref 11.5–15.5)
WBC: 9.8 10*3/uL (ref 4.0–10.5)
nRBC: 0.5 % — ABNORMAL HIGH (ref 0.0–0.2)

## 2019-07-11 MED ORDER — POLYETHYLENE GLYCOL 3350 17 G PO PACK: 17.0000 g | PACK | Freq: Every day | ORAL | 0 refills | Status: DC | PRN

## 2019-07-11 MED ORDER — CEFTRIAXONE IV (FOR PTA / DISCHARGE USE ONLY): 2.0000 g | INTRAVENOUS | 0 refills | Status: AC

## 2019-07-11 MED ORDER — GABAPENTIN 300 MG PO CAPS: 300.0000 mg | ORAL_CAPSULE | Freq: Three times a day (TID) | ORAL | 0 refills | Status: DC

## 2019-07-11 MED ORDER — OXYCODONE HCL 5 MG PO TABS: 5.0000 mg | ORAL_TABLET | ORAL | 0 refills | Status: DC | PRN

## 2019-07-11 MED ORDER — ASPIRIN 81 MG PO CHEW: 81.0000 mg | CHEWABLE_TABLET | Freq: Two times a day (BID) | ORAL | 0 refills | Status: AC

## 2019-07-11 MED ORDER — METHOCARBAMOL 500 MG PO TABS: 500.0000 mg | ORAL_TABLET | Freq: Four times a day (QID) | ORAL | 0 refills | Status: DC | PRN

## 2019-07-11 MED ORDER — FERROUS SULFATE 325 (65 FE) MG PO TBEC: 325.0000 mg | DELAYED_RELEASE_TABLET | Freq: Two times a day (BID) | ORAL | 3 refills | Status: DC

## 2019-07-11 MED ORDER — HEPARIN SOD (PORK) LOCK FLUSH 100 UNIT/ML IV SOLN
250.0000 [IU] | INTRAVENOUS | Status: AC | PRN
  Administered 2019-07-11: 250 [IU]
  Filled 2019-07-11: qty 2.5

## 2019-07-11 NOTE — Progress Notes (Signed)
Patient discharged to home w/ dtr. Given all belongings, instructions, prescriptions, equipment. Verbalized understanding of all instructions. Wound vac changed to prevena pump. PICC care instructions reviewed w/ patient and dtr w/ home infusion rep. Escorted to pov via w/c.

## 2019-07-11 NOTE — Telephone Encounter (Signed)
error 

## 2019-07-11 NOTE — Discharge Summary (Signed)
Physician Discharge Summary  GENESIA CASLIN HAL:[id number] DOB: 06-20-1950 DOA: 07/05/2019  PCP: Leanna Battles, MD  Admit date: 07/05/2019 Discharge date: 07/11/2019  Time spent: 45 minutes  Recommendations for Outpatient Follow-up:  1. Complete ceftriaxone as per instructions on 4/7 which would be 6 weeks total 2. Needs screening labs with Opalski screening 3. Pain meds in addition to other meds prescribed on discharge 4. Weightbearing precautions weightbearing as tolerated with posterior hip precautions aspirin 81 mg twice a day for 6 weeks-we will go home with portable Prevena unit for wound improved  Discharge Diagnoses:  Principal Problem:   Infection of left prosthetic hip joint [facility])   Discharge Condition: improved  Diet recommendation: Heart healthy  Filed Weights   07/05/19 1805  Weight: 109.8 kg    History of present illness:  69 white female HTN HLD reflux  prior right hip replacement 2017-underwent surgery for avascular necrosis of left hip status post T HA 05/16/2018 Return to hospital severe headache fever myalgias 2/23 and found to have left hip pain Orthopedics consulted Also found to have AKI on admission  Hospital Course:  Cellulitis versus infected hardware of hip secondary to likely group A strep Blood culture growing group A strep from 2/23-repeat cultures performed 2/25 have been persistently negative--intra-op cult also pending--PICC placed 2/27 and will get ceftriaxone through 4/7 with weekly CBC ESR reported to regional center for infectious disease Continue oxycodone 5 mg every 4 as needed for pain-this was prescribed orthopedics  Drains were removed as per Dr. Lyla Glassing and patient will go home with posterior hip precautions and instructions as per above AKI Likely ATN from  ibuprofen use and underlying use of Hyzaar and may be Mobic-all of been discontinued at this time Resume Mobic on discharge only Recheck labs-saline locked on 2/27 Anemia Blood  counts dropped from 10-7 on 2/26 therefore transfused 2 units PRBC now stabilizing-still in the 8 range and may require further replacement in terms of p.o. iron on discharge he can get CBC differential and Chem-12 in 1 week Starting iron this admission Thrombocytopenia Possibly related to infection counts are improved during hospital stay-monitor outpatient trends Hypokalemia Replace with potassium 40-resolved and needs outpatient follow-up HTN Continue amlodipine 5 mg daily-may need to augment meds in the outpatient setting as discontinued Hyzaar this admission  Procedures:  Debridement left hip had ball and liner exchange 2/25   Consultations:  Orthopedics  Infectious disease  Discharge Exam: Vitals:   07/11/19 0926 07/11/19 1353  BP: 123/61 125/66  Pulse:  77  Resp:  20  Temp:  99 F (37.2 C)  SpO2:  99%    General: **Awake coherent no distress EOMI NCAT no focal deficit* Cardiovascular: S1-S2 no murmur rub or gallop Respiratory: Chest clear no added sound Abdomen obese Able to ambulate slightly did not visualize any redness today wound not examined  Discharge Instructions   Discharge Instructions    Diet - low sodium heart healthy   Complete by: As directed    Discharge instructions   Complete by: As directed    Patient will go home with home infusion of ceftriaxone via the PICC line until 08/16/2019 and labs will be set up in addition to home health with nursing to help assist with this Your PICC line will need to be removed  after this therapy and this will be set up with home health- you will get the portable Pravena wound VAC system as per Dr. Lyla Glassing and you should follow-up with him in the  outpatient setting you will be taking aspirin 81 mg twice daily to prevent DVTs and you will be given pain medications as per your physician in addition to some new meds of gabapentin as well as Robaxin for spasm Please note that some your blood pressure medications have  been discontinued and they can be resumed if your blood pressure is high in the outpatient setting   Home infusion instructions   Complete by: As directed    Instructions: Flushing of vascular access device: 0.9% NaCl pre/post medication administration and prn patency; Heparin 100 u/ml, 21m for implanted ports and Heparin 10u/ml, 559mfor all other central venous catheters.   Increase activity slowly   Complete by: As directed      Allergies as of 07/11/2019   No Known Allergies     Medication List    STOP taking these medications   aspirin EC 325 MG tablet Replaced by: aspirin 81 MG chewable tablet   losartan-hydrochlorothiazide 100-12.5 MG tablet Commonly known as: HYZAAR   sennosides-docusate sodium 8.6-50 MG tablet Commonly known as: SENOKOT-S     TAKE these medications   acetaminophen 500 MG tablet Commonly known as: TYLENOL Take 500 mg by mouth every 6 (six) hours as needed for headache.   amLODipine 5 MG tablet Commonly known as: NORVASC Take 5 mg by mouth daily.   aspirin 81 MG chewable tablet Commonly known as: Aspirin Childrens Chew 1 tablet (81 mg total) by mouth 2 (two) times daily with a meal. Replaces: aspirin EC 325 MG tablet   baclofen 10 MG tablet Commonly known as: LIORESAL Take 1 tablet (10 mg total) by mouth 3 (three) times daily. As needed for muscle spasm   cefTRIAXone  IVPB Commonly known as: ROCEPHIN Inject 2 g into the vein daily. Indication: prosthetic joint infection Last Day of Therapy: 08/16/2019 Labs - Once weekly:  CBC/D and BMP, Labs - Every other week:  ESR and CRP   gabapentin 300 MG capsule Commonly known as: NEURONTIN Take 1 capsule (300 mg total) by mouth 3 (three) times daily.   ibuprofen 200 MG tablet Commonly known as: ADVIL Take 200 mg by mouth every 6 (six) hours as needed for headache.   ICAPS AREDS 2 PO Take 2 tablets by mouth daily.   meloxicam 15 MG tablet Commonly known as: MOBIC Take 15 mg by mouth daily as  needed for pain.   methocarbamol 500 MG tablet Commonly known as: ROBAXIN Take 1 tablet (500 mg total) by mouth every 6 (six) hours as needed for muscle spasms.   ondansetron 4 MG tablet Commonly known as: Zofran Take 1 tablet (4 mg total) by mouth every 8 (eight) hours as needed for nausea or vomiting.   oxyCODONE 5 MG immediate release tablet Commonly known as: Roxicodone Take 1 tablet (5 mg total) by mouth every 4 (four) hours as needed for severe pain.   pantoprazole 20 MG tablet Commonly known as: PROTONIX Take 20 mg by mouth daily as needed for heartburn.   polyethylene glycol 17 g packet Commonly known as: MIRALAX / GLYCOLAX Take 17 g by mouth daily as needed for mild constipation.   pravastatin 20 MG tablet Commonly known as: PRAVACHOL Take 20 mg by mouth daily.            Home Infusion Instuctions  (From admission, onward)         Start     Ordered   07/11/19 0000  Home infusion instructions    Question:  Instructions  Answer:  Flushing of vascular access device: 0.9% NaCl pre/post medication administration and prn patency; Heparin 100 u/ml, 67m for implanted ports and Heparin 10u/ml, 544mfor all other central venous catheters.   07/11/19 1510         No Known Allergies Follow-up Information    Swinteck, BrAaron EdelmanMD. Schedule an appointment as soon as possible for a visit on 07/18/2019.   Specialty: Orthopedic Surgery Contact information: [id number] [phone]          The results of significant diagnostics from this hospitalization (including imaging, microbiology, ancillary and laboratory) are listed below for reference.    Significant Diagnostic Studies: CT HIP LEFT W CONTRAST  Result Date: 07/05/2019 CLINICAL DATA:  Fever. Rash. Recent left total hip replacement on 05/17/2019. EXAM: CT OF THE LOWER LEFT EXTREMITY WITH CONTRAST TECHNIQUE: Multidetector CT imaging of the lower left extremity was  performed according to the standard protocol following intravenous contrast administration. COMPARISON:  Radiographs dated 05/17/2019 CONTRAST:  8036mMNIPAQUE IOHEXOL 300 MG/ML  SOLN FINDINGS: Bones/Joint/Cartilage The components of the left total hip prosthesis appear in excellent position with no evidence of loosening or fracture. Subcortical cysts in the superior aspect of the left acetabulum are not felt to be significant. The visualized pelvic bones are otherwise normal. No appreciable left hip effusion. Muscles and Tendons Normal. Soft tissues There in the long gated rim enhancing fluid collection in the subcutaneous fat at the lateral aspect of the hip at the site of prior incision. This measures 15.5 x 4.0 x 2.2 cm. There is soft tissue stranding in the in the adjacent subcutaneous fat. This fluid collection extends to the muscle fascia at the lateral aspect of the hip. Some detail is obscured by the metallic artifact caused by the prosthesis. IMPRESSION: Abscess or postoperative seroma in the subcutaneous fat lateral to the left hip along the surgical incision track. Electronically Signed   By: JamLorriane ShireD.   On: 07/05/2019 20:08   DG Pelvis Portable  Result Date: 07/07/2019 CLINICAL DATA:  Left hip debridement and liner exchange EXAM: PORTABLE PELVIS 1-2 VIEWS COMPARISON:  06/09/2019 FINDINGS: Bilateral total hip arthroplasty hardware remains in its expected alignment. No periprosthetic lucency or fracture. A surgical drain projects over the left pelvis. Air seen within the soft tissues lateral to the left hip, presumably postoperative. IMPRESSION: Bilateral total hip arthroplasty hardware remains in its expected alignment. Electronically Signed   By: NicDavina PokeO.   On: 07/07/2019 15:36   DG FLUORO GUIDED NEEDLE PLC ASPIRATION/INJECTION LOC  Result Date: 07/06/2019 CLINICAL DATA:  Fever, rash, left total hip replaced EXAM: LEFT HIP INJECTION UNDER FLUOROSCOPY FLUOROSCOPY TIME:   Fluoroscopy Time:  30 seconds Radiation Exposure Index (if provided by the fluoroscopic device): 5 mGy PROCEDURE: Overlying skin prepped with Betadine, draped in the usual sterile fashion, and infiltrated locally with buffered Lidocaine. Curved 20 gauge spinal needle advanced to the superolateral margin of the left femoral head with needle felt against the prosthesis. Fluid could not be aspirated despite repositioning. Approximately 3 cc of sterile saline was injected. There was no significant return of fluid upon attempted aspiration. Approximately 5 cc of Omnipaque 300 was injected and needle was felt to be appropriately located in this postoperative patient. No immediate complication. IMPRESSION: Unsuccessful left hip aspiration under fluoroscopy. Electronically Signed   By: PraMacy MisD.   On: 07/06/2019 16:31   US [place] SITE RITE  Result Date: 07/08/2019 If  Site Rite image not attached, placement could not be confirmed due to current cardiac rhythm.   Microbiology: Recent Results (from the past 240 hour(s))  Culture, blood (single) w Reflex to ID Panel     Status: Abnormal   Collection Time: 07/05/19  6:28 PM   Specimen: BLOOD  Result Value Ref Range Status   Specimen Description   Final    BLOOD RIGHT ANTECUBITAL Performed at [facility] [address] [place], [place] [address]    Special Requests   Final    BOTTLES DRAWN AEROBIC ONLY Blood Culture results may not be optimal due to an excessive volume of blood received in culture bottles Performed at [facility] [address], [place], [place] [address]    Culture  Setup Time   Final    GRAM POSITIVE COCCI IN PAIRS AND CHAINS AEROBIC BOTTLE ONLY CRITICAL RESULT CALLED TO, READ BACK BY AND VERIFIED WITH: D. WOFFORD, PHARMD ([facility]) AT 1130 ON 07/06/19 BY C. JESSUP, MT.    Culture (A)  Final    STREPTOCOCCUS PYOGENES HEALTH DEPARTMENT NOTIFIED Performed at [facility], [place]  [address], [place],  [address]    Report Status 07/09/2019 FINAL  Final   Organism ID, Bacteria STREPTOCOCCUS PYOGENES  Final      Susceptibility   Streptococcus pyogenes - MIC*    PENICILLIN <=0.06 SENSITIVE Sensitive     CEFTRIAXONE <=0.12 SENSITIVE Sensitive     ERYTHROMYCIN <=0.12 SENSITIVE Sensitive     LEVOFLOXACIN <=0.25 SENSITIVE Sensitive     VANCOMYCIN <=0.12 SENSITIVE Sensitive     * STREPTOCOCCUS PYOGENES  Respiratory Panel by RT PCR (Flu A&B, Covid) - Nasopharyngeal Swab     Status: None   Collection Time: 07/05/19 10:05 PM   Specimen: Nasopharyngeal Swab  Result Value Ref Range Status   SARS Coronavirus 2 by RT PCR NEGATIVE NEGATIVE Final    Comment: (NOTE) SARS-CoV-2 target nucleic acids are NOT DETECTED. The SARS-CoV-2 RNA is generally detectable in upper respiratoy specimens during the acute phase of infection. The lowest concentration of SARS-CoV-2 viral copies this assay can detect is 131 copies/mL. A negative result does not preclude SARS-Cov-2 infection and should not be used as the sole basis for treatment or other patient management decisions. A negative result may occur with  improper specimen collection/handling, submission of specimen other than nasopharyngeal swab, presence of viral mutation(s) within the areas targeted by this assay, and inadequate number of viral copies (<131 copies/mL). A negative result must be combined with clinical observations, patient history, and epidemiological information. The expected result is Negative. Fact Sheet for Patients:  PinkCheek.be Fact Sheet for Healthcare Providers:  GravelBags.it This test is not yet ap proved or cleared by the [place] FDA and  has been authorized for detection and/or diagnosis of SARS-CoV-2 by FDA under an Emergency Use Authorization (EUA). This EUA will remain  in effect (meaning this test can be used) for the duration of  the COVID-19 declaration under Section 564(b)(1) of the Act, 21 U.S.C. section 360bbb-3(b)(1), unless the authorization is terminated or revoked sooner.    Influenza A by PCR NEGATIVE NEGATIVE Final   Influenza B by PCR NEGATIVE NEGATIVE Final    Comment: (NOTE) The Xpert Xpress SARS-CoV-2/FLU/RSV assay is intended as an aid in  the diagnosis of influenza from Nasopharyngeal swab specimens and  should not be used as a sole basis for treatment. Nasal washings and  aspirates are unacceptable for Xpert Xpress SARS-CoV-2/FLU/RSV  testing. Fact Sheet for Patients: PinkCheek.be Fact Sheet for Healthcare Providers: GravelBags.it This test is not yet approved or cleared by the [place] FDA and  has been authorized for detection and/or diagnosis of SARS-CoV-2 by  FDA under an Emergency Use Authorization (EUA). This EUA will remain  in effect (meaning this test can be used) for the duration of the  Covid-19 declaration under Section 564(b)(1) of the Act, 21  U.S.C. section 360bbb-3(b)(1), unless the authorization is  terminated or revoked. Performed at [facility], [place] [address], [place], [place] [address]   Surgical pcr screen     Status: Abnormal   Collection Time: 07/06/19 12:34 PM   Specimen: Nasal Mucosa; Nasal Swab  Result Value Ref Range Status   MRSA, PCR NEGATIVE NEGATIVE Final   Staphylococcus aureus POSITIVE (A) NEGATIVE Final    Comment: (NOTE) The Xpert SA Assay (FDA approved for NASAL specimens in patients 32 years of age and older), is one component of a comprehensive surveillance program. It is not intended to diagnose infection nor to guide or monitor treatment. Performed at [facility], [place] [address], [place], [place] [address]   Culture, blood (routine x 2)     Status: None (Preliminary result)   Collection Time: 07/07/19  5:47 AM   Specimen: BLOOD  Result Value  Ref Range Status   Specimen Description   Final    BLOOD RIGHT ARM Performed at [facility] [address], [place], [place] [address]    Special Requests   Final    BOTTLES DRAWN AEROBIC AND ANAEROBIC Blood Culture adequate volume Performed at [employer] [address] [place], [place] [address]    Culture   Final    NO GROWTH 4 DAYS Performed at [facility], [place] [address] [place], [place] [address]    Report Status PENDING  Incomplete  Culture, blood (routine x 2)     Status: None (Preliminary result)   Collection Time: 07/07/19  5:47 AM   Specimen: BLOOD RIGHT HAND  Result Value Ref Range Status   Specimen Description   Final    BLOOD RIGHT HAND Performed at [facility] [address], [place], [place] [address]    Special Requests   Final    BOTTLES DRAWN AEROBIC ONLY Blood Culture results may not be optimal due to an inadequate volume of blood received in culture bottles Performed at [facility] [address] [place], [place] [address]    Culture   Final    NO GROWTH 4 DAYS Performed at [facility], [place] [address], [place], [place] [address]    Report Status PENDING  Incomplete  Aerobic/Anaerobic Culture (surgical/deep wound)     Status: None (Preliminary result)   Collection Time: 07/07/19 12:52 PM   Specimen: PATH Other; Tissue  Result Value Ref Range Status   Specimen Description   Final    TISSUE LT HIP PSEUDOCAPSULE Performed at [facility] [address], [place], [place] [address]    Special Requests   Final    PATIENT ON FOLLOWING ROCEPHIN Performed at [facility], [place] [address], [place], [place] [address]    Gram Stain   Final    ABUNDANT WBC PRESENT,BOTH PMN AND MONONUCLEAR RARE GRAM POSITIVE COCCI Performed at [facility], [place] [address], [place],  [address]    Culture   Final  RARE GROUP A  STREP (S.PYOGENES) ISOLATED NO ANAEROBES ISOLATED; CULTURE IN PROGRESS FOR 5 DAYS    Report Status PENDING  Incomplete  Aerobic/Anaerobic Culture (surgical/deep wound)     Status: None (Preliminary result)   Collection Time: 07/07/19 12:52 PM   Specimen: PATH Cytology Misc. fluid; Body Fluid  Result Value Ref Range Status   Specimen Description   Final    TISSUE LT HIP NECROTIC FAT SUBSUTANEOUS Performed at [facility] [address] [place], [place] [address]    Special Requests   Final    PATIENT ON FOLLOWING ROCEPHIN Performed at [facility], [place] [address], [place], [place] [address]    Gram Stain   Final    ABUNDANT WBC PRESENT, PREDOMINANTLY PMN NO ORGANISMS SEEN    Culture   Final    NO GROWTH 4 DAYS NO ANAEROBES ISOLATED; CULTURE IN PROGRESS FOR 5 DAYS Performed at [facility] [address], [place], [place] [address]    Report Status PENDING  Incomplete  Aerobic/Anaerobic Culture (surgical/deep wound)     Status: None (Preliminary result)   Collection Time: 07/07/19 12:52 PM   Specimen: PATH Cytology Misc. fluid; Body Fluid  Result Value Ref Range Status   Specimen Description   Final    HIP LT SUPERFICIAL COLLECTION Performed at [facility] [address] [place], [place] [address]    Special Requests   Final    PATIENT ON FOLLOWING ROCEPHIN Performed at [facility], [place] [address] [place], [place] [address]    Gram Stain   Final    ABUNDANT WBC PRESENT,BOTH PMN AND MONONUCLEAR ABUNDANT GRAM POSITIVE COCCI Performed at [facility], [place] [address] [place], [place] [address]    Culture   Final    FEW GROUP A STREP (S.PYOGENES) ISOLATED NO ANAEROBES ISOLATED; CULTURE IN PROGRESS FOR 5 DAYS    Report Status PENDING  Incomplete     Labs: Basic Metabolic Panel: Recent Labs  Lab 07/06/19 0456 07/07/19 0547 07/08/19 0357 07/09/19 0348 07/11/19 0325  NA 141  139 140 142 140  K 3.6 3.0* 4.0 4.1 4.1  CL 107 108 113* 115* 109  CO2 21* 20* 21* 19* 23  GLUCOSE 115* 116* 141* 134* 80  BUN 29* 27* 25* 34* 18  CREATININE 1.83* 1.35* 1.05* 1.14* 0.75  CALCIUM 8.2* 8.0* 7.8* 7.9* 8.1*  PHOS  --   --   --  2.2*  --    Liver Function Tests: Recent Labs  Lab 07/05/19 1828 07/09/19 0348  AST 44*  --   ALT 36  --   ALKPHOS 126  --   BILITOT 0.7  --   PROT 6.9  --   ALBUMIN 3.7 2.5*   No results for input(s): LIPASE, AMYLASE in the last 168 hours. No results for input(s): AMMONIA in the last 168 hours. CBC: Recent Labs  Lab 07/08/19 0357 07/08/19 1831 07/09/19 0348 07/10/19 0355 07/11/19 0325  WBC 8.8 11.2* 10.5 7.8 9.8  NEUTROABS  --   --   --   --  4.9  HGB 7.1* 9.3* 8.9* 8.2* 8.5*  HCT 22.6* 28.9* 27.1* 25.4* 27.0*  MCV 100.0 95.1 94.1 95.8 96.1  PLT 120* 140* 141* 166 196   Cardiac Enzymes: No results for input(s): CKTOTAL, CKMB, CKMBINDEX, TROPONINI in the last 168 hours. BNP: BNP (last 3 results) No results for input(s): BNP in the last 8760 hours.  ProBNP (last  3 results) No results for input(s): PROBNP in the last 8760 hours.  CBG: No results for input(s): GLUCAP in the last 168 hours.     Signed:  Nita Sells MD   [facility] 07/11/2019, 3:12 PM

## 2019-07-11 NOTE — Progress Notes (Signed)
Physical Therapy Treatment Patient Details Name: Wendy Ray MRN: [id number] DOB: 02-07-1951 Today's Date: 07/11/2019    History of Present Illness Pt s/p I&D of L THR with ball and liner exchange.  Pt with hx of bil THR.    PT Comments    Pt making good progress.  Needing min cues for hip precautions, transfer techniques, and exercise techniques.  She was able to ambulate 250' with RW safely .  Demonstrates mobility necessary to return home with family from PT perspective, but will continue to benefit from PT to advance while hospitalized.    Follow Up Recommendations  Home health PT;Follow surgeon's recommendation for DC plan and follow-up therapies     Equipment Recommendations  None recommended by PT    Recommendations for Other Services       Precautions / Restrictions Precautions Precautions: Posterior Hip;Fall Precaution Booklet Issued: Yes (comment) Precaution Comments: Pt recalls 3/3 THP without cues Required Braces or Orthoses: Other Brace Other Brace: THR wedge in place when in bed  Restrictions Weight Bearing Restrictions: No Other Position/Activity Restrictions: WBAT    Mobility  Bed Mobility               General bed mobility comments: in chair at arrival; discussed hip precautions with supine/sit and use of gait belt or family to assist L LE  Transfers Overall transfer level: Needs assistance Equipment used: Rolling walker (2 wheeled) Transfers: Sit to/from Stand Sit to Stand: Supervision         General transfer comment: cues for LE management, adherence to THP, and use of UEs to self assist  Ambulation/Gait Ambulation/Gait assistance: Supervision Gait Distance (Feet): 250 Feet Assistive device: Rolling walker (2 wheeled) Gait Pattern/deviations: Trunk flexed;Step-through pattern;Decreased stance time - left;Decreased stride length;Antalgic Gait velocity: decr   General Gait Details: 2 standing rest breaks; cues for RW  proximity   Stairs Stairs: (pt has ramp)           Wheelchair Mobility    Modified Rankin (Stroke Patients Only)       Balance Overall balance assessment: Needs assistance Sitting-balance support: No upper extremity supported;Feet supported Sitting balance-Leahy Scale: Good     Standing balance support: Bilateral upper extremity supported Standing balance-Leahy Scale: Fair                              Cognition Arousal/Alertness: Awake/alert Behavior During Therapy: WFL for tasks assessed/performed Overall Cognitive Status: Within Functional Limits for tasks assessed                                        Exercises Total Joint Exercises Ankle Circles/Pumps: AROM;Both;15 reps;Supine Quad Sets: AROM;Both;10 reps;Supine Gluteal Sets: AROM;Both;10 reps;Supine Heel Slides: AAROM;Left;Supine;10 reps Hip ABduction/ADduction: AAROM;10 reps;Left;Supine Long Arc Quad: AROM;Left;Seated;10 reps    General Comments        Pertinent Vitals/Pain Pain Assessment: 0-10 Pain Score: 3  Pain Location: L hip Pain Descriptors / Indicators: Sore Pain Intervention(s): Limited activity within patient's tolerance;Monitored during session;Repositioned    Home Living                      Prior Function            PT Goals (current goals can now be found in the care plan section) Progress towards PT goals: Progressing toward goals  Frequency    7X/week      PT Plan Current plan remains appropriate    Co-evaluation              AM-PAC PT "6 Clicks" Mobility   Outcome Measure  Help needed turning from your back to your side while in a flat bed without using bedrails?: A Lot Help needed moving from lying on your back to sitting on the side of a flat bed without using bedrails?: A Little Help needed moving to and from a bed to a chair (including a wheelchair)?: None Help needed standing up from a chair using your arms  (e.g., wheelchair or bedside chair)?: None Help needed to walk in hospital room?: None Help needed climbing 3-5 steps with a railing? : A Little 6 Click Score: 20    End of Session Equipment Utilized During Treatment: Gait belt Activity Tolerance: Patient tolerated treatment well Patient left: in chair;with call bell/phone within reach Nurse Communication: Mobility status PT Visit Diagnosis: Difficulty in walking, not elsewhere classified (R26.2)     Time: [id number] PT Time Calculation (min) (ACUTE ONLY): 25 min  Charges:  $Gait Training: 8-22 mins $Therapeutic Exercise: 8-22 mins                     Maggie Font, PT Acute Rehab Services Pager [phone] [facility] [phone] [facility] [phone]    Karlton Lemon 07/11/2019, 11:14 AM

## 2019-07-11 NOTE — Discharge Instructions (Signed)
Dr. Rod Can Joint Replacement Specialist [facility] : [facility] [address], [place], [place] [address] [phone]   TOTAL HIP REPLACEMENT POSTOPERATIVE DIRECTIONS    Hip Rehabilitation, Guidelines Following Surgery   WEIGHT BEARING Weight bearing as tolerated with assist device (walker, cane, etc) as directed, use it as long as suggested by your surgeon or therapist, typically at least 4-6 weeks.  The results of a hip operation are greatly improved after range of motion and muscle strengthening exercises. Follow all safety measures which are given to protect your hip. If any of these exercises cause increased pain or swelling in your joint, decrease the amount until you are comfortable again. Then slowly increase the exercises. Call your caregiver if you have problems or questions.   HOME CARE INSTRUCTIONS  Most of the following instructions are designed to prevent the dislocation of your new hip.  Remove items at home which could result in a fall. This includes throw rugs or furniture in walking pathways.  Continue medications as instructed at time of discharge.  You may have some home medications which will be placed on hold until you complete the course of blood thinner medication.  Do not remove your dressing.  Keep dressing clean and dry. Do not put on socks or shoes without following the instructions of your caregivers.   Sit on chairs with arms. Use the chair arms to help push yourself up when arising.  Arrange for the use of a toilet seat elevator so you are not sitting low.   Walk with walker as instructed.  You may resume a sexual relationship in one month or when given the OK by your caregiver.  Use walker as long as suggested by your caregivers.  You may put full weight on your legs and walk as much as is comfortable. Avoid periods of inactivity such as sitting longer than an hour when not asleep. This helps prevent blood clots.  You may  return to work once you are cleared by Engineer, production.  Do not drive a car for 6 weeks or until released by your surgeon.  Do not drive while taking narcotics.  Wear elastic stockings for two weeks following surgery during the day but you may remove then at night.  Make sure you keep all of your appointments after your operation with all of your doctors and caregivers. You should call the office at the above phone number and make an appointment for approximately two weeks after the date of your surgery. Please pick up a stool softener and laxative for home use as long as you are requiring pain medications.  ICE to the affected hip every three hours for 30 minutes at a time and then as needed for pain and swelling. Continue to use ice on the hip for pain and swelling from surgery. You may notice swelling that will progress down to the foot and ankle.  This is normal after surgery.  Elevate the leg when you are not up walking on it.   It is important for you to complete the blood thinner medication as prescribed by your doctor.  Continue to use the breathing machine which will help keep your temperature down.  It is common for your temperature to cycle up and down following surgery, especially at night when you are not up moving around and exerting yourself.  The breathing machine keeps your lungs expanded and your temperature down.  RANGE OF MOTION AND STRENGTHENING EXERCISES  These exercises are designed to  help you keep full movement of your hip joint. Follow your caregiver's or physical therapist's instructions. Perform all exercises about fifteen times, three times per day or as directed. Exercise both hips, even if you have had only one joint replacement. These exercises can be done on a training (exercise) mat, on the floor, on a table or on a bed. Use whatever works the best and is most comfortable for you. Use music or television while you are exercising so that the exercises are a pleasant break  in your day. This will make your life better with the exercises acting as a break in routine you can look forward to.  Lying on your back, slowly slide your foot toward your buttocks, raising your knee up off the floor. Then slowly slide your foot back down until your leg is straight again.  Lying on your back spread your legs as far apart as you can without causing discomfort.  Lying on your side, raise your upper leg and foot straight up from the floor as far as is comfortable. Slowly lower the leg and repeat.  Lying on your back, tighten up the muscle in the front of your thigh (quadriceps muscles). You can do this by keeping your leg straight and trying to raise your heel off the floor. This helps strengthen the largest muscle supporting your knee.  Lying on your back, tighten up the muscles of your buttocks both with the legs straight and with the knee bent at a comfortable angle while keeping your heel on the floor.   SKILLED REHAB INSTRUCTIONS: If the patient is transferred to a skilled rehab facility following release from the hospital, a list of the current medications will be sent to the facility for the patient to continue.  When discharged from the skilled rehab facility, please have the facility set up the patient's [place] prior to being released. Also, the skilled facility will be responsible for providing the patient with their medications at time of release from the facility to include their pain medication and their blood thinner medication. If the patient is still at the rehab facility at time of the two week follow up appointment, the skilled rehab facility will also need to assist the patient in arranging follow up appointment in our office and any transportation needs.  MAKE SURE YOU:  Understand these instructions.  Will watch your condition.  Will get help right away if you are not doing well or get worse.  Pick up stool softner and laxative for home use  following surgery while on pain medications. Do not remove your dressing. Keep dressing clean and dry. Charge portable VAC unit nightly. JP drain: every 8 hours: empty bulb, record drainage on log sheet, and recharge bulb. Bring log sheet to your follow up appointment. Continue to use ice for pain and swelling after surgery. Do not use any lotions or creams on the incision until instructed by your surgeon. Posterior Hip Precautions. Call [phone] ASAP to schedule a follow up appointment for 7 days after discharge.

## 2019-07-11 NOTE — TOC Progression Note (Addendum)
Transition of Care [facility]) - Progression Note    Patient Details  Name: Wendy Ray MRN: [id number] Date of Birth: 1950-11-04  Transition of [facility]) CM/SW Contact  Leeroy Cha, RN Phone Number: 07/11/2019, 11:36 AM  Clinical Narrative:    Carolynn Sayers alerted for iv abx usage at home. hsving great difficulty finding hhc for rn to taKE PATIENTS INSURANCE       Expected Discharge Plan and Services                                                 Social Determinants of Health (SDOH) Interventions    Readmission Risk Interventions No flowsheet data found.

## 2019-07-11 NOTE — Progress Notes (Signed)
Subjective:  Patient reports pain as mild to moderate.  Denies N/V/CP/SOB. No c/o.  Objective:   VITALS:   Vitals:   07/10/19 2140 07/11/19 0459 07/11/19 0811 07/11/19 0926  BP: 127/62 123/60 133/67 123/61  Pulse: 72 70 70   Resp: 17 16 18    Temp: 98.3 F (36.8 C) 98.4 F (36.9 C) 99 F (37.2 C)   TempSrc:  Oral Oral   SpO2: 98% 95% 98%   Weight:      Height:       HV 70 cc in 24 hrs JP 130 cc in 24 hrs   NAD ABD soft Sensation intact distally Intact pulses distally Dorsiflexion/Plantar flexion intact Compartment soft iVAC intact without leak JP ss HV ss  Lab Results  Component Value Date   WBC 9.8 07/11/2019   HGB 8.5 (L) 07/11/2019   HCT 27.0 (L) 07/11/2019   MCV 96.1 07/11/2019   PLT 196 07/11/2019   BMET    Component Value Date/Time   NA 140 07/11/2019 0325   K 4.1 07/11/2019 0325   CL 109 07/11/2019 0325   CO2 23 07/11/2019 0325   GLUCOSE 80 07/11/2019 0325   BUN 18 07/11/2019 0325   CREATININE 0.75 07/11/2019 0325   CALCIUM 8.1 (L) 07/11/2019 0325   GFRNONAA >60 07/11/2019 0325   GFRAA >60 07/11/2019 0325    Recent Results (from the past 240 hour(s))  Culture, blood (single) w Reflex to ID Panel     Status: Abnormal   Collection Time: 07/05/19  6:28 PM   Specimen: BLOOD  Result Value Ref Range Status   Specimen Description   Final    BLOOD RIGHT ANTECUBITAL Performed at [facility], [place] [address] [place], [place] [address]    Special Requests   Final    BOTTLES DRAWN AEROBIC ONLY Blood Culture results may not be optimal due to an excessive volume of blood received in culture bottles Performed at [facility] [address] [place], [place] [address]    Culture  Setup Time   Final    GRAM POSITIVE COCCI IN PAIRS AND CHAINS AEROBIC BOTTLE ONLY CRITICAL RESULT CALLED TO, READ BACK BY AND VERIFIED WITH: D. WOFFORD, PHARMD ([facility]) AT 1130 ON 07/06/19 BY C. JESSUP, MT.    Culture (A)  Final   STREPTOCOCCUS PYOGENES HEALTH DEPARTMENT NOTIFIED Performed at [facility], [place] [address], [place], [place] [address]    Report Status 07/09/2019 FINAL  Final   Organism ID, Bacteria STREPTOCOCCUS PYOGENES  Final      Susceptibility   Streptococcus pyogenes - MIC*    PENICILLIN <=0.06 SENSITIVE Sensitive     CEFTRIAXONE <=0.12 SENSITIVE Sensitive     ERYTHROMYCIN <=0.12 SENSITIVE Sensitive     LEVOFLOXACIN <=0.25 SENSITIVE Sensitive     VANCOMYCIN <=0.12 SENSITIVE Sensitive     * STREPTOCOCCUS PYOGENES  Respiratory Panel by RT PCR (Flu A&B, Covid) - Nasopharyngeal Swab     Status: None   Collection Time: 07/05/19 10:05 PM   Specimen: Nasopharyngeal Swab  Result Value Ref Range Status   SARS Coronavirus 2 by RT PCR NEGATIVE NEGATIVE Final    Comment: (NOTE) SARS-CoV-2 target nucleic acids are NOT DETECTED. The SARS-CoV-2 RNA is generally detectable in upper respiratoy specimens during the acute phase of infection. The lowest concentration of SARS-CoV-2 viral copies this assay can detect is 131 copies/mL. A negative result does not preclude SARS-Cov-2 infection and should not be used as the  sole basis for treatment or other patient management decisions. A negative result may occur with  improper specimen collection/handling, submission of specimen other than nasopharyngeal swab, presence of viral mutation(s) within the areas targeted by this assay, and inadequate number of viral copies (<131 copies/mL). A negative result must be combined with clinical observations, patient history, and epidemiological information. The expected result is Negative. Fact Sheet for Patients:  PinkCheek.be Fact Sheet for Healthcare Providers:  GravelBags.it This test is not yet ap proved or cleared by the [place] FDA and  has been authorized for detection and/or diagnosis of SARS-CoV-2 by FDA under an Emergency Use Authorization  (EUA). This EUA will remain  in effect (meaning this test can be used) for the duration of the COVID-19 declaration under Section 564(b)(1) of the Act, 21 U.S.C. section 360bbb-3(b)(1), unless the authorization is terminated or revoked sooner.    Influenza A by PCR NEGATIVE NEGATIVE Final   Influenza B by PCR NEGATIVE NEGATIVE Final    Comment: (NOTE) The Xpert Xpress SARS-CoV-2/FLU/RSV assay is intended as an aid in  the diagnosis of influenza from Nasopharyngeal swab specimens and  should not be used as a sole basis for treatment. Nasal washings and  aspirates are unacceptable for Xpert Xpress SARS-CoV-2/FLU/RSV  testing. Fact Sheet for Patients: PinkCheek.be Fact Sheet for Healthcare Providers: GravelBags.it This test is not yet approved or cleared by the [place] FDA and  has been authorized for detection and/or diagnosis of SARS-CoV-2 by  FDA under an Emergency Use Authorization (EUA). This EUA will remain  in effect (meaning this test can be used) for the duration of the  Covid-19 declaration under Section 564(b)(1) of the Act, 21  U.S.C. section 360bbb-3(b)(1), unless the authorization is  terminated or revoked. Performed at [facility], [place] [address], [place], [place] [address]   Surgical pcr screen     Status: Abnormal   Collection Time: 07/06/19 12:34 PM   Specimen: Nasal Mucosa; Nasal Swab  Result Value Ref Range Status   MRSA, PCR NEGATIVE NEGATIVE Final   Staphylococcus aureus POSITIVE (A) NEGATIVE Final    Comment: (NOTE) The Xpert SA Assay (FDA approved for NASAL specimens in patients 73 years of age and older), is one component of a comprehensive surveillance program. It is not intended to diagnose infection nor to guide or monitor treatment. Performed at [facility], [place] [address] [place], [place] [address]   Culture, blood (routine x 2)     Status: None  (Preliminary result)   Collection Time: 07/07/19  5:47 AM   Specimen: BLOOD  Result Value Ref Range Status   Specimen Description   Final    BLOOD RIGHT ARM Performed at [facility] [address] [place], [place] [address]    Special Requests   Final    BOTTLES DRAWN AEROBIC AND ANAEROBIC Blood Culture adequate volume Performed at [facility] [address], [place], [place] [address]    Culture   Final    NO GROWTH 4 DAYS Performed at [facility], [place] [address], [place], [place] [address]    Report Status PENDING  Incomplete  Culture, blood (routine x 2)     Status: None (Preliminary result)   Collection Time: 07/07/19  5:47 AM   Specimen: BLOOD RIGHT HAND  Result Value Ref Range Status   Specimen Description   Final    BLOOD RIGHT HAND Performed at [facility]  [address] [place], [place] [address]    Special Requests   Final    BOTTLES DRAWN AEROBIC ONLY Blood Culture results may not be optimal due to an inadequate volume of blood received in culture bottles Performed at [facility] [address], [place], [place] [address]    Culture   Final    NO GROWTH 4 DAYS Performed at [facility], [place] [address], [place], [place] [address]    Report Status PENDING  Incomplete  Aerobic/Anaerobic Culture (surgical/deep wound)     Status: None (Preliminary result)   Collection Time: 07/07/19 12:52 PM   Specimen: PATH Other; Tissue  Result Value Ref Range Status   Specimen Description   Final    TISSUE LT HIP PSEUDOCAPSULE Performed at [facility] [address] [place], [place] [address]    Special Requests   Final    PATIENT ON FOLLOWING ROCEPHIN Performed at [facility], [place] [address] [place], [place] [address]    Gram Stain   Final    ABUNDANT WBC PRESENT,BOTH PMN AND MONONUCLEAR RARE GRAM POSITIVE COCCI Performed at [facility], [place] [address], [place], [place] [address]    Culture   Final    RARE GROUP A STREP (S.PYOGENES) ISOLATED NO ANAEROBES ISOLATED; CULTURE IN PROGRESS FOR 5 DAYS    Report Status PENDING  Incomplete  Aerobic/Anaerobic Culture (surgical/deep wound)     Status: None (Preliminary result)   Collection Time: 07/07/19 12:52 PM   Specimen: PATH Cytology Misc. fluid; Body Fluid  Result Value Ref Range Status   Specimen Description   Final    TISSUE LT HIP NECROTIC FAT SUBSUTANEOUS Performed at [facility] [address] [place], [place] [address]    Special Requests   Final    PATIENT ON FOLLOWING ROCEPHIN Performed at [facility], [place] [address] [place], [place] [address]    Gram Stain   Final    ABUNDANT WBC PRESENT, PREDOMINANTLY PMN NO ORGANISMS SEEN    Culture   Final    NO GROWTH 4 DAYS NO ANAEROBES ISOLATED; CULTURE IN PROGRESS FOR 5 DAYS Performed at [facility] [address] [place], [place] [address]    Report Status PENDING  Incomplete  Aerobic/Anaerobic Culture (surgical/deep wound)     Status: None (Preliminary result)   Collection Time: 07/07/19 12:52 PM   Specimen: PATH Cytology Misc. fluid; Body Fluid  Result Value Ref Range Status   Specimen Description   Final    HIP LT SUPERFICIAL COLLECTION Performed at [facility] [address], [place], [place] [address]    Special Requests   Final    PATIENT ON FOLLOWING ROCEPHIN Performed at [facility], [place] [address], [place], [place] [address]    Gram Stain   Final    ABUNDANT WBC PRESENT,BOTH PMN AND MONONUCLEAR ABUNDANT GRAM POSITIVE COCCI Performed at [facility], [place] [address] [place], [place] [address]    Culture   Final    FEW GROUP A STREP (S.PYOGENES) ISOLATED NO ANAEROBES ISOLATED; CULTURE IN PROGRESS FOR 5 DAYS    Report Status PENDING  Incomplete      Assessment/Plan: 4 Days Post-Op    Principal Problem:   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)  S/p L hip head ball / liner exchange for PJI  WBAT with walker, posterior hip precautions DVT ppx: Lovenox in house -->  home on ASA 81 mg PO BID for 6 weeks, SCDs, TEDS PO pain control PT/OT L hip PJI - Strep pyogenes: IV abx per ID (Rocephin), PICC placed ABLA: received 2 units PRBCs, stable Dispo: d/c HV drain today, d/c home with JP, plan to d/c home with portable Prevena unit (at bedside), f/u 1 week   Hilton Cork Kyrstyn Greear 07/11/2019, 1:36 PM   Rod Can, MD [phone] [place] is now [facility]  [place] [address] [place], [place], [place] [address] Phone: [phone] [email or web address] Facebook  [employer]

## 2019-07-11 NOTE — Progress Notes (Signed)
     Subjective:  Patient reports pain as mild.  Patient overall in good spirits, denies chest pain, shortness of breath, lightheadedness, feels better than she did last week.    Objective:   VITALS:   Vitals:   07/10/19 0619 07/10/19 1444 07/10/19 2140 07/11/19 0459  BP: 131/76 (!) 134/57 127/62 123/60  Pulse: 61 71 72 70  Resp: 16 17 17 16   Temp: 98.1 F (36.7 C) 98.3 F (36.8 C) 98.3 F (36.8 C) 98.4 F (36.9 C)  TempSrc: Oral Oral  Oral  SpO2: 99% 100% 98% 95%  Weight:      Height:       Suction dressing intact, EHL and FHL are intact, drains in place.  Lab Results  Component Value Date   WBC 9.8 07/11/2019   HGB 8.5 (L) 07/11/2019   HCT 27.0 (L) 07/11/2019   MCV 96.1 07/11/2019   PLT 196 07/11/2019   BMET    Component Value Date/Time   NA 140 07/11/2019 0325   K 4.1 07/11/2019 0325   CL 109 07/11/2019 0325   CO2 23 07/11/2019 0325   GLUCOSE 80 07/11/2019 0325   BUN 18 07/11/2019 0325   CREATININE 0.75 07/11/2019 0325   CALCIUM 8.1 (L) 07/11/2019 0325   GFRNONAA >60 07/11/2019 0325   GFRAA >60 07/11/2019 0325     Assessment/Plan: 4 Days Post-Op   Left hip surgical site infection, status post total hip arthroplasty  IV antibiotics, PICC line, culture so far has grown out group A streptococcus, although she was on preoperative antibiotics.  Antibiotic selection per infectious disease, I greatly appreciate Dr. Sid Falcon help with her case, she may be able to go home today, I will defer to Dr. Lyla Glassing, the plan is for her to follow-up as an outpatient with Dr. Lyla Glassing, plan also to maintain the drains until seen in the office.  Anticipated LOS equal to or greater than 2 midnights due to - Age 40 and older with one or more of the following:  - Obesity  - Expected need for hospital services (PT, OT, Nursing) required for safe  discharge  - Anticipated need for postoperative skilled nursing care or inpatient rehab  - Active co-morbidities: SSI OR    - Unanticipated findings during/Post Surgery: Infected knee joint or operative site  - Patient is a high risk of re-admission due to: None     Wendy Ray 07/11/2019, 7:38 AM   Marchia Bond, MD Cell [phone]

## 2019-07-12 DIAGNOSIS — T8452XA Infection and inflammatory reaction due to internal left hip prosthesis, initial encounter: Secondary | ICD-10-CM | POA: Diagnosis not present

## 2019-07-12 LAB — AEROBIC/ANAEROBIC CULTURE W GRAM STAIN (SURGICAL/DEEP WOUND): Culture: NO GROWTH

## 2019-07-12 LAB — CULTURE, BLOOD (ROUTINE X 2)
Culture: NO GROWTH
Culture: NO GROWTH
Special Requests: ADEQUATE

## 2019-07-13 DIAGNOSIS — Z471 Aftercare following joint replacement surgery: Secondary | ICD-10-CM | POA: Diagnosis not present

## 2019-07-13 DIAGNOSIS — Z9181 History of falling: Secondary | ICD-10-CM | POA: Diagnosis not present

## 2019-07-13 DIAGNOSIS — Z6841 Body Mass Index (BMI) 40.0 and over, adult: Secondary | ICD-10-CM | POA: Diagnosis not present

## 2019-07-13 DIAGNOSIS — I10 Essential (primary) hypertension: Secondary | ICD-10-CM | POA: Diagnosis not present

## 2019-07-13 DIAGNOSIS — E785 Hyperlipidemia, unspecified: Secondary | ICD-10-CM | POA: Diagnosis not present

## 2019-07-13 DIAGNOSIS — K219 Gastro-esophageal reflux disease without esophagitis: Secondary | ICD-10-CM | POA: Diagnosis not present

## 2019-07-13 DIAGNOSIS — E669 Obesity, unspecified: Secondary | ICD-10-CM | POA: Diagnosis not present

## 2019-07-13 DIAGNOSIS — T8489XA Other specified complication of internal orthopedic prosthetic devices, implants and grafts, initial encounter: Secondary | ICD-10-CM | POA: Diagnosis not present

## 2019-07-13 DIAGNOSIS — A400 Sepsis due to streptococcus, group A: Secondary | ICD-10-CM | POA: Diagnosis not present

## 2019-07-13 DIAGNOSIS — L03116 Cellulitis of left lower limb: Secondary | ICD-10-CM | POA: Diagnosis not present

## 2019-07-13 DIAGNOSIS — Z452 Encounter for adjustment and management of vascular access device: Secondary | ICD-10-CM | POA: Diagnosis not present

## 2019-07-13 DIAGNOSIS — Z86718 Personal history of other venous thrombosis and embolism: Secondary | ICD-10-CM | POA: Diagnosis not present

## 2019-07-13 DIAGNOSIS — T8452XA Infection and inflammatory reaction due to internal left hip prosthesis, initial encounter: Secondary | ICD-10-CM | POA: Diagnosis not present

## 2019-07-13 DIAGNOSIS — H539 Unspecified visual disturbance: Secondary | ICD-10-CM | POA: Diagnosis not present

## 2019-07-13 DIAGNOSIS — L02416 Cutaneous abscess of left lower limb: Secondary | ICD-10-CM | POA: Diagnosis not present

## 2019-07-20 ENCOUNTER — Encounter: Payer: Self-pay | Admitting: Internal Medicine

## 2019-07-20 DIAGNOSIS — T8452XA Infection and inflammatory reaction due to internal left hip prosthesis, initial encounter: Secondary | ICD-10-CM | POA: Diagnosis not present

## 2019-07-27 ENCOUNTER — Encounter: Payer: Self-pay | Admitting: Internal Medicine

## 2019-07-27 DIAGNOSIS — I10 Essential (primary) hypertension: Secondary | ICD-10-CM | POA: Diagnosis not present

## 2019-07-27 DIAGNOSIS — K219 Gastro-esophageal reflux disease without esophagitis: Secondary | ICD-10-CM | POA: Diagnosis not present

## 2019-07-27 DIAGNOSIS — Z86718 Personal history of other venous thrombosis and embolism: Secondary | ICD-10-CM | POA: Diagnosis not present

## 2019-07-27 DIAGNOSIS — E785 Hyperlipidemia, unspecified: Secondary | ICD-10-CM | POA: Diagnosis not present

## 2019-07-27 DIAGNOSIS — L02416 Cutaneous abscess of left lower limb: Secondary | ICD-10-CM | POA: Diagnosis not present

## 2019-07-27 DIAGNOSIS — E669 Obesity, unspecified: Secondary | ICD-10-CM | POA: Diagnosis not present

## 2019-07-27 DIAGNOSIS — T8452XA Infection and inflammatory reaction due to internal left hip prosthesis, initial encounter: Secondary | ICD-10-CM | POA: Diagnosis not present

## 2019-07-27 DIAGNOSIS — L03116 Cellulitis of left lower limb: Secondary | ICD-10-CM | POA: Diagnosis not present

## 2019-07-27 DIAGNOSIS — Z9181 History of falling: Secondary | ICD-10-CM | POA: Diagnosis not present

## 2019-07-27 DIAGNOSIS — Z6841 Body Mass Index (BMI) 40.0 and over, adult: Secondary | ICD-10-CM | POA: Diagnosis not present

## 2019-07-27 DIAGNOSIS — A400 Sepsis due to streptococcus, group A: Secondary | ICD-10-CM | POA: Diagnosis not present

## 2019-07-27 DIAGNOSIS — Z471 Aftercare following joint replacement surgery: Secondary | ICD-10-CM | POA: Diagnosis not present

## 2019-07-27 DIAGNOSIS — H539 Unspecified visual disturbance: Secondary | ICD-10-CM | POA: Diagnosis not present

## 2019-07-27 DIAGNOSIS — Z452 Encounter for adjustment and management of vascular access device: Secondary | ICD-10-CM | POA: Diagnosis not present

## 2019-07-27 DIAGNOSIS — T8489XA Other specified complication of internal orthopedic prosthetic devices, implants and grafts, initial encounter: Secondary | ICD-10-CM | POA: Diagnosis not present

## 2019-07-28 DIAGNOSIS — T8452XA Infection and inflammatory reaction due to internal left hip prosthesis, initial encounter: Secondary | ICD-10-CM | POA: Diagnosis not present

## 2019-08-01 ENCOUNTER — Encounter: Payer: Self-pay | Admitting: Internal Medicine

## 2019-08-01 ENCOUNTER — Other Ambulatory Visit: Payer: Self-pay

## 2019-08-01 ENCOUNTER — Telehealth: Payer: Self-pay

## 2019-08-01 ENCOUNTER — Ambulatory Visit (INDEPENDENT_AMBULATORY_CARE_PROVIDER_SITE_OTHER): Payer: PPO | Admitting: Internal Medicine

## 2019-08-01 VITALS — BP 116/86 | HR 101 | Wt 248.0 lb

## 2019-08-01 DIAGNOSIS — Z5181 Encounter for therapeutic drug level monitoring: Secondary | ICD-10-CM | POA: Diagnosis not present

## 2019-08-01 DIAGNOSIS — Z452 Encounter for adjustment and management of vascular access device: Secondary | ICD-10-CM

## 2019-08-01 DIAGNOSIS — T8452XD Infection and inflammatory reaction due to internal left hip prosthesis, subsequent encounter: Secondary | ICD-10-CM | POA: Diagnosis not present

## 2019-08-01 MED ORDER — AMOXICILLIN 500 MG PO CAPS: 500.0000 mg | ORAL_CAPSULE | Freq: Three times a day (TID) | ORAL | 5 refills | Status: DC

## 2019-08-01 NOTE — Assessment & Plan Note (Signed)
Working well, no issues.  Will have this pulled after the last dose April 7th.

## 2019-08-01 NOTE — Assessment & Plan Note (Signed)
She is improving from the infection.  Will continue to review labs and plan to continue through April 7th then transition to oral amoxicillin 500 mg TID.  I plan to have her on this typically for 3-6 months.  It was expressed though that Dr. Lyla Glassing feels she needs lifetime suppression, though I don't see any particular indication.  She could take amoxicillin 500 mg bid after the initial 6 months.

## 2019-08-01 NOTE — Telephone Encounter (Signed)
Per Dr. Linus Salmons, okay to pull PICC after last dose of IV abx on 4/8. Relayed orders to Coretta at [facility]. Patient notified.   Kaz Auld Lorita Officer, RN

## 2019-08-01 NOTE — Assessment & Plan Note (Signed)
Creat wnl

## 2019-08-01 NOTE — Progress Notes (Signed)
   Subjective:    Patient ID: Wendy Ray, female    DOB: [birth year], 70 y.o.   MRN: [id number]  HPI Here for hsfu She underwent left total hip arthroplasty 05/17/19 by Dr. Mardelle Matte and initially doing well but then developed pain and erythema, leukocytosis and elevated inflammatory markers.  Debrided and had polyechange and head ball exchange by Dr. Lyla Glassing on 07/07/19.  Culture grew group a Strep and in blood culture and plan for ceftriaxone through 08/17/19.  Picc working well, no new complaints.  She reports that Dr. Lyla Glassing feels she should be on antibiotics for lifetime suppression.  She is walking more.   ESR 84, CRP done was cardiac   Review of Systems  Constitutional: Negative for chills, fatigue and fever.  Gastrointestinal: Negative for diarrhea and nausea.  Skin: Negative for rash.       Objective:   Physical Exam Constitutional:      Appearance: Normal appearance.  Eyes:     General: No scleral icterus. Pulmonary:     Effort: Pulmonary effort is normal.  Neurological:     General: No focal deficit present.     Mental Status: She is alert.  Psychiatric:        Mood and Affect: Mood normal.   SH: no tobacco        Assessment & Plan:

## 2019-08-03 DIAGNOSIS — T8452XA Infection and inflammatory reaction due to internal left hip prosthesis, initial encounter: Secondary | ICD-10-CM | POA: Diagnosis not present

## 2019-08-04 DIAGNOSIS — T8452XA Infection and inflammatory reaction due to internal left hip prosthesis, initial encounter: Secondary | ICD-10-CM | POA: Diagnosis not present

## 2019-08-10 ENCOUNTER — Encounter: Payer: Self-pay | Admitting: Internal Medicine

## 2019-08-10 DIAGNOSIS — T8452XA Infection and inflammatory reaction due to internal left hip prosthesis, initial encounter: Secondary | ICD-10-CM | POA: Diagnosis not present

## 2019-08-11 DIAGNOSIS — T8452XA Infection and inflammatory reaction due to internal left hip prosthesis, initial encounter: Secondary | ICD-10-CM | POA: Diagnosis not present

## 2019-08-17 DIAGNOSIS — L03116 Cellulitis of left lower limb: Secondary | ICD-10-CM | POA: Diagnosis not present

## 2019-08-17 DIAGNOSIS — L02416 Cutaneous abscess of left lower limb: Secondary | ICD-10-CM | POA: Diagnosis not present

## 2019-08-17 DIAGNOSIS — Z6841 Body Mass Index (BMI) 40.0 and over, adult: Secondary | ICD-10-CM | POA: Diagnosis not present

## 2019-08-17 DIAGNOSIS — I10 Essential (primary) hypertension: Secondary | ICD-10-CM | POA: Diagnosis not present

## 2019-08-17 DIAGNOSIS — A400 Sepsis due to streptococcus, group A: Secondary | ICD-10-CM | POA: Diagnosis not present

## 2019-08-17 DIAGNOSIS — Z471 Aftercare following joint replacement surgery: Secondary | ICD-10-CM | POA: Diagnosis not present

## 2019-08-17 DIAGNOSIS — E669 Obesity, unspecified: Secondary | ICD-10-CM | POA: Diagnosis not present

## 2019-08-17 DIAGNOSIS — Z452 Encounter for adjustment and management of vascular access device: Secondary | ICD-10-CM | POA: Diagnosis not present

## 2019-08-17 DIAGNOSIS — H539 Unspecified visual disturbance: Secondary | ICD-10-CM | POA: Diagnosis not present

## 2019-08-17 DIAGNOSIS — K219 Gastro-esophageal reflux disease without esophagitis: Secondary | ICD-10-CM | POA: Diagnosis not present

## 2019-08-17 DIAGNOSIS — T8489XA Other specified complication of internal orthopedic prosthetic devices, implants and grafts, initial encounter: Secondary | ICD-10-CM | POA: Diagnosis not present

## 2019-08-17 DIAGNOSIS — T8452XA Infection and inflammatory reaction due to internal left hip prosthesis, initial encounter: Secondary | ICD-10-CM | POA: Diagnosis not present

## 2019-08-17 DIAGNOSIS — Z86718 Personal history of other venous thrombosis and embolism: Secondary | ICD-10-CM | POA: Diagnosis not present

## 2019-08-17 DIAGNOSIS — E785 Hyperlipidemia, unspecified: Secondary | ICD-10-CM | POA: Diagnosis not present

## 2019-08-17 DIAGNOSIS — Z9181 History of falling: Secondary | ICD-10-CM | POA: Diagnosis not present

## 2019-08-22 DIAGNOSIS — T8459XD Infection and inflammatory reaction due to other internal joint prosthesis, subsequent encounter: Secondary | ICD-10-CM | POA: Diagnosis not present

## 2019-09-01 DIAGNOSIS — Z6841 Body Mass Index (BMI) 40.0 and over, adult: Secondary | ICD-10-CM | POA: Diagnosis not present

## 2019-09-01 DIAGNOSIS — Z86718 Personal history of other venous thrombosis and embolism: Secondary | ICD-10-CM | POA: Diagnosis not present

## 2019-09-01 DIAGNOSIS — L02416 Cutaneous abscess of left lower limb: Secondary | ICD-10-CM | POA: Diagnosis not present

## 2019-09-01 DIAGNOSIS — E669 Obesity, unspecified: Secondary | ICD-10-CM | POA: Diagnosis not present

## 2019-09-01 DIAGNOSIS — Z9181 History of falling: Secondary | ICD-10-CM | POA: Diagnosis not present

## 2019-09-01 DIAGNOSIS — Z452 Encounter for adjustment and management of vascular access device: Secondary | ICD-10-CM | POA: Diagnosis not present

## 2019-09-01 DIAGNOSIS — K219 Gastro-esophageal reflux disease without esophagitis: Secondary | ICD-10-CM | POA: Diagnosis not present

## 2019-09-01 DIAGNOSIS — H539 Unspecified visual disturbance: Secondary | ICD-10-CM | POA: Diagnosis not present

## 2019-09-01 DIAGNOSIS — L03116 Cellulitis of left lower limb: Secondary | ICD-10-CM | POA: Diagnosis not present

## 2019-09-01 DIAGNOSIS — Z471 Aftercare following joint replacement surgery: Secondary | ICD-10-CM | POA: Diagnosis not present

## 2019-09-01 DIAGNOSIS — T8489XA Other specified complication of internal orthopedic prosthetic devices, implants and grafts, initial encounter: Secondary | ICD-10-CM | POA: Diagnosis not present

## 2019-09-01 DIAGNOSIS — T8452XA Infection and inflammatory reaction due to internal left hip prosthesis, initial encounter: Secondary | ICD-10-CM | POA: Diagnosis not present

## 2019-09-01 DIAGNOSIS — E785 Hyperlipidemia, unspecified: Secondary | ICD-10-CM | POA: Diagnosis not present

## 2019-09-01 DIAGNOSIS — I10 Essential (primary) hypertension: Secondary | ICD-10-CM | POA: Diagnosis not present

## 2019-09-01 DIAGNOSIS — A400 Sepsis due to streptococcus, group A: Secondary | ICD-10-CM | POA: Diagnosis not present

## 2019-09-07 ENCOUNTER — Encounter: Payer: Self-pay | Admitting: Internal Medicine

## 2019-09-07 ENCOUNTER — Ambulatory Visit: Payer: PPO | Admitting: Internal Medicine

## 2019-09-07 ENCOUNTER — Other Ambulatory Visit: Payer: Self-pay

## 2019-09-07 VITALS — BP 129/84 | HR 86 | Temp 97.8°F | Ht 65.0 in | Wt 246.0 lb

## 2019-09-07 DIAGNOSIS — T8452XD Infection and inflammatory reaction due to internal left hip prosthesis, subsequent encounter: Secondary | ICD-10-CM

## 2019-09-07 DIAGNOSIS — Z5181 Encounter for therapeutic drug level monitoring: Secondary | ICD-10-CM

## 2019-09-07 DIAGNOSIS — Z452 Encounter for adjustment and management of vascular access device: Secondary | ICD-10-CM

## 2019-09-07 NOTE — Progress Notes (Signed)
   Subjective:    Patient ID: Wendy Ray, female    DOB: 01-17-1951, 69 y.o.   MRN: [id number]  HPI Here for hsfu She underwent left total hip arthroplasty 05/17/19 by Dr. Mardelle Matte and initially doing well but then developed pain and erythema, leukocytosis and elevated inflammatory markers.  Debrided and had polyechange and head ball exchange by Dr. Lyla Glassing on 07/07/19.  Culture grew group a Strep and in blood culture and plan for ceftriaxone through 08/17/19 and has cvompleted with picc removed.  Started on oral amoxicillin and no rash or diarrhea.  Moving more and improved.     Review of Systems  Constitutional: Negative for chills, fatigue and fever.  Gastrointestinal: Negative for diarrhea and nausea.  Skin: Negative for rash.       Objective:   Physical Exam Constitutional:      Appearance: Normal appearance.  Eyes:     General: No scleral icterus. Pulmonary:     Effort: Pulmonary effort is normal.  Neurological:     General: No focal deficit present.     Mental Status: She is alert.  Psychiatric:        Mood and Affect: Mood normal.   SH: no tobacco        Assessment & Plan:

## 2019-09-07 NOTE — Assessment & Plan Note (Signed)
Doing well and transitioned to oral continuation therapy.  Will continue for 6 months through October 8th Will check inflammatory markers today rtc 3 months.

## 2019-09-07 NOTE — Assessment & Plan Note (Signed)
Will check bmp and cbc on medications.

## 2019-09-07 NOTE — Assessment & Plan Note (Signed)
Tolerated well and now removed.

## 2019-09-08 LAB — CBC WITH DIFFERENTIAL/PLATELET
Absolute Monocytes: 647 cells/uL (ref 200–950)
Basophils Absolute: 33 cells/uL (ref 0–200)
Basophils Relative: 0.5 %
Eosinophils Absolute: 323 cells/uL (ref 15–500)
Eosinophils Relative: 4.9 %
HCT: 42 % (ref 35.0–45.0)
Hemoglobin: 13.6 g/dL (ref 11.7–15.5)
Lymphs Abs: 1703 cells/uL (ref 850–3900)
MCH: 29.8 pg (ref 27.0–33.0)
MCHC: 32.4 g/dL (ref 32.0–36.0)
MCV: 91.9 fL (ref 80.0–100.0)
MPV: 10.8 fL (ref 7.5–12.5)
Monocytes Relative: 9.8 %
Neutro Abs: 3894 cells/uL (ref 1500–7800)
Neutrophils Relative %: 59 %
Platelets: 227 10*3/uL (ref 140–400)
RBC: 4.57 10*6/uL (ref 3.80–5.10)
RDW: 13.4 % (ref 11.0–15.0)
Total Lymphocyte: 25.8 %
WBC: 6.6 10*3/uL (ref 3.8–10.8)

## 2019-09-08 LAB — BASIC METABOLIC PANEL
BUN: 22 mg/dL (ref 7–25)
CO2: 26 mmol/L (ref 20–32)
Calcium: 10 mg/dL (ref 8.6–10.4)
Chloride: 109 mmol/L (ref 98–110)
Creat: 0.92 mg/dL (ref 0.50–0.99)
Glucose, Bld: 87 mg/dL (ref 65–99)
Potassium: 5 mmol/L (ref 3.5–5.3)
Sodium: 143 mmol/L (ref 135–146)

## 2019-09-08 LAB — C-REACTIVE PROTEIN: CRP: 6.8 mg/L (ref <8.0)

## 2019-09-08 LAB — SEDIMENTATION RATE: Sed Rate: 2 mm/h (ref 0–30)

## 2019-09-26 ENCOUNTER — Other Ambulatory Visit: Payer: Self-pay | Admitting: Internal Medicine

## 2019-09-26 DIAGNOSIS — Z1231 Encounter for screening mammogram for malignant neoplasm of breast: Secondary | ICD-10-CM

## 2019-09-27 ENCOUNTER — Other Ambulatory Visit: Payer: Self-pay

## 2019-09-27 ENCOUNTER — Ambulatory Visit
Admission: RE | Admit: 2019-09-27 | Discharge: 2019-09-27 | Disposition: A | Discharge status: Home / Self Care | Payer: PPO | Source: Ambulatory Visit | Attending: Internal Medicine | Admitting: Internal Medicine

## 2019-09-27 DIAGNOSIS — Z1231 Encounter for screening mammogram for malignant neoplasm of breast: Secondary | ICD-10-CM

## 2019-10-12 DIAGNOSIS — D62 Acute posthemorrhagic anemia: Secondary | ICD-10-CM | POA: Diagnosis not present

## 2019-10-12 DIAGNOSIS — I10 Essential (primary) hypertension: Secondary | ICD-10-CM | POA: Diagnosis not present

## 2019-10-12 DIAGNOSIS — R600 Localized edema: Secondary | ICD-10-CM | POA: Diagnosis not present

## 2019-10-12 DIAGNOSIS — T8579XD Infection and inflammatory reaction due to other internal prosthetic devices, implants and grafts, subsequent encounter: Secondary | ICD-10-CM | POA: Diagnosis not present

## 2019-12-12 ENCOUNTER — Other Ambulatory Visit: Payer: Self-pay

## 2019-12-12 ENCOUNTER — Encounter: Payer: Self-pay | Admitting: Internal Medicine

## 2019-12-12 ENCOUNTER — Ambulatory Visit: Payer: PPO | Admitting: Internal Medicine

## 2019-12-12 VITALS — BP 115/75 | HR 75 | Temp 97.6°F | Wt 249.0 lb

## 2019-12-12 DIAGNOSIS — T8452XD Infection and inflammatory reaction due to internal left hip prosthesis, subsequent encounter: Secondary | ICD-10-CM

## 2019-12-12 DIAGNOSIS — Z5181 Encounter for therapeutic drug level monitoring: Secondary | ICD-10-CM | POA: Diagnosis not present

## 2019-12-12 NOTE — Assessment & Plan Note (Signed)
She is experiencing no side effects or concerns.   Will continue with the same.

## 2019-12-12 NOTE — Progress Notes (Signed)
   Subjective:    Patient ID: Wendy Ray, female    DOB: [birth year], 69 y.o.   MRN: [id number]  HPI Here for hsfu She underwent left total hip arthroplasty 05/17/19 by Dr. Mardelle Matte and initially doing well but then developed pain and erythema, leukocytosis and elevated inflammatory markers.  Debrided and had polyechange and head ball exchange by Dr. Lyla Glassing on 07/07/19.  Culture grew group A Strep I the hip and in a blood culture and completed 6 weeks of ceftriaxone in April.  Started on oral amoxicillin and no rash or diarrhea and has been on this for 4 months.  Hip feels good.    Review of Systems  Constitutional: Negative for chills, fatigue and fever.  Gastrointestinal: Negative for diarrhea and nausea.  Skin: Negative for rash.       Objective:   Physical Exam Constitutional:      Appearance: Normal appearance.  Eyes:     General: No scleral icterus. Pulmonary:     Effort: Pulmonary effort is normal.  Neurological:     General: No focal deficit present.     Mental Status: She is alert.  Psychiatric:        Mood and Affect: Mood normal.   SH: no tobacco        Assessment & Plan:

## 2019-12-12 NOTE — Assessment & Plan Note (Signed)
She continues to do well with her treatment for her PJI s/p polyexchange.   Will continue with amoxicillin through 10/8 then stop.  Will check ESR and CRP today rtc 2 months

## 2019-12-13 ENCOUNTER — Ambulatory Visit: Payer: PPO | Admitting: Internal Medicine

## 2019-12-13 LAB — SEDIMENTATION RATE: Sed Rate: 6 mm/h (ref 0–30)

## 2019-12-13 LAB — C-REACTIVE PROTEIN: CRP: 2.9 mg/L (ref <8.0)

## 2020-01-18 DIAGNOSIS — Z85828 Personal history of other malignant neoplasm of skin: Secondary | ICD-10-CM | POA: Diagnosis not present

## 2020-01-18 DIAGNOSIS — L570 Actinic keratosis: Secondary | ICD-10-CM | POA: Diagnosis not present

## 2020-02-21 ENCOUNTER — Ambulatory Visit: Payer: PPO | Admitting: Internal Medicine

## 2020-02-27 ENCOUNTER — Other Ambulatory Visit: Payer: Self-pay

## 2020-02-27 ENCOUNTER — Ambulatory Visit: Payer: PPO | Admitting: Internal Medicine

## 2020-02-27 ENCOUNTER — Encounter: Payer: Self-pay | Admitting: Internal Medicine

## 2020-02-27 VITALS — BP 128/77 | HR 66 | Wt 250.0 lb

## 2020-02-27 DIAGNOSIS — T8452XD Infection and inflammatory reaction due to internal left hip prosthesis, subsequent encounter: Secondary | ICD-10-CM | POA: Diagnosis not present

## 2020-02-27 DIAGNOSIS — Z96642 Presence of left artificial hip joint: Secondary | ICD-10-CM | POA: Diagnosis not present

## 2020-02-27 NOTE — Assessment & Plan Note (Signed)
Stable now and followed by orthopedics as needed

## 2020-02-27 NOTE — Progress Notes (Signed)
   Subjective:    Patient ID: Leandrew Koyanagi, female    DOB: [birth year], 69 y.o.   MRN: [id number]  HPI Here for hsfu She underwent left total hip arthroplasty 05/17/19 by Dr. Mardelle Matte and initially doing well but then developed pain and erythema, leukocytosis and elevated inflammatory markers.  Debrided and had polyechange and head ball exchange by Dr. Lyla Glassing on 07/07/19.  Culture grew group A Strep I the hip and in a blood culture and completed 6 weeks of ceftriaxone in April.  Started on oral amoxicillin and no rash or diarrhea and has completed 6 months treatment for continuation.  Hip feels good, no new issues.  Has been off of the amoxicillin for about 1 week.    Review of Systems  Constitutional: Negative for chills, fatigue and fever.  Gastrointestinal: Negative for diarrhea and nausea.  Skin: Negative for rash.       Objective:   Physical Exam Constitutional:      Appearance: Normal appearance.  Eyes:     General: No scleral icterus. Pulmonary:     Effort: Pulmonary effort is normal.  Musculoskeletal:     Comments: Good ROM/walking  Neurological:     General: No focal deficit present.     Mental Status: She is alert.  Psychiatric:        Mood and Affect: Mood normal.           Assessment & Plan:

## 2020-02-27 NOTE — Assessment & Plan Note (Signed)
She continues to do well and now has been off antibiotics for about 1 week with no apparent flaring of pain or difficulty with walking.   I will recheck the inflammatory markers now off of antibiotics and if reassuring, no follow up needed.

## 2020-02-28 LAB — SEDIMENTATION RATE: Sed Rate: 2 mm/h (ref 0–30)

## 2020-02-28 LAB — C-REACTIVE PROTEIN: CRP: 2.9 mg/L (ref <8.0)

## 2020-03-19 DIAGNOSIS — J309 Allergic rhinitis, unspecified: Secondary | ICD-10-CM | POA: Diagnosis not present

## 2020-03-19 DIAGNOSIS — R42 Dizziness and giddiness: Secondary | ICD-10-CM | POA: Diagnosis not present

## 2020-05-01 DIAGNOSIS — M47896 Other spondylosis, lumbar region: Secondary | ICD-10-CM | POA: Diagnosis not present

## 2020-05-01 DIAGNOSIS — M5136 Other intervertebral disc degeneration, lumbar region: Secondary | ICD-10-CM | POA: Diagnosis not present

## 2020-05-01 DIAGNOSIS — M25551 Pain in right hip: Secondary | ICD-10-CM | POA: Diagnosis not present

## 2020-05-01 DIAGNOSIS — M25552 Pain in left hip: Secondary | ICD-10-CM | POA: Diagnosis not present

## 2020-06-07 DIAGNOSIS — Z0000 Encounter for general adult medical examination without abnormal findings: Secondary | ICD-10-CM | POA: Diagnosis not present

## 2020-06-07 DIAGNOSIS — E785 Hyperlipidemia, unspecified: Secondary | ICD-10-CM | POA: Diagnosis not present

## 2020-06-07 DIAGNOSIS — R739 Hyperglycemia, unspecified: Secondary | ICD-10-CM | POA: Diagnosis not present

## 2020-06-14 DIAGNOSIS — K219 Gastro-esophageal reflux disease without esophagitis: Secondary | ICD-10-CM | POA: Diagnosis not present

## 2020-06-14 DIAGNOSIS — J309 Allergic rhinitis, unspecified: Secondary | ICD-10-CM | POA: Diagnosis not present

## 2020-06-14 DIAGNOSIS — M5136 Other intervertebral disc degeneration, lumbar region: Secondary | ICD-10-CM | POA: Diagnosis not present

## 2020-06-14 DIAGNOSIS — F5104 Psychophysiologic insomnia: Secondary | ICD-10-CM | POA: Diagnosis not present

## 2020-06-14 DIAGNOSIS — E785 Hyperlipidemia, unspecified: Secondary | ICD-10-CM | POA: Diagnosis not present

## 2020-06-14 DIAGNOSIS — R739 Hyperglycemia, unspecified: Secondary | ICD-10-CM | POA: Diagnosis not present

## 2020-06-14 DIAGNOSIS — Z0000 Encounter for general adult medical examination without abnormal findings: Secondary | ICD-10-CM | POA: Diagnosis not present

## 2020-06-14 DIAGNOSIS — Z1331 Encounter for screening for depression: Secondary | ICD-10-CM | POA: Diagnosis not present

## 2020-06-14 DIAGNOSIS — R82998 Other abnormal findings in urine: Secondary | ICD-10-CM | POA: Diagnosis not present

## 2020-06-22 ENCOUNTER — Other Ambulatory Visit: Payer: Self-pay | Admitting: Neurosurgery

## 2020-06-22 DIAGNOSIS — M5416 Radiculopathy, lumbar region: Secondary | ICD-10-CM

## 2020-07-25 DIAGNOSIS — L821 Other seborrheic keratosis: Secondary | ICD-10-CM | POA: Diagnosis not present

## 2020-07-25 DIAGNOSIS — L570 Actinic keratosis: Secondary | ICD-10-CM | POA: Diagnosis not present

## 2020-07-25 DIAGNOSIS — D225 Melanocytic nevi of trunk: Secondary | ICD-10-CM | POA: Diagnosis not present

## 2020-07-25 DIAGNOSIS — Z85828 Personal history of other malignant neoplasm of skin: Secondary | ICD-10-CM | POA: Diagnosis not present

## 2020-09-07 IMAGING — RF DG FLUORO GUIDE NDL PLC/BX
1 series · 1 of 1 positions shown · non-contrast
Comparison: none

CLINICAL DATA: Fever, rash, left total hip replaced

[Series 1: cp_standard · 0.17mm/px · 1 of 1 slices shown]
[im 1/1]
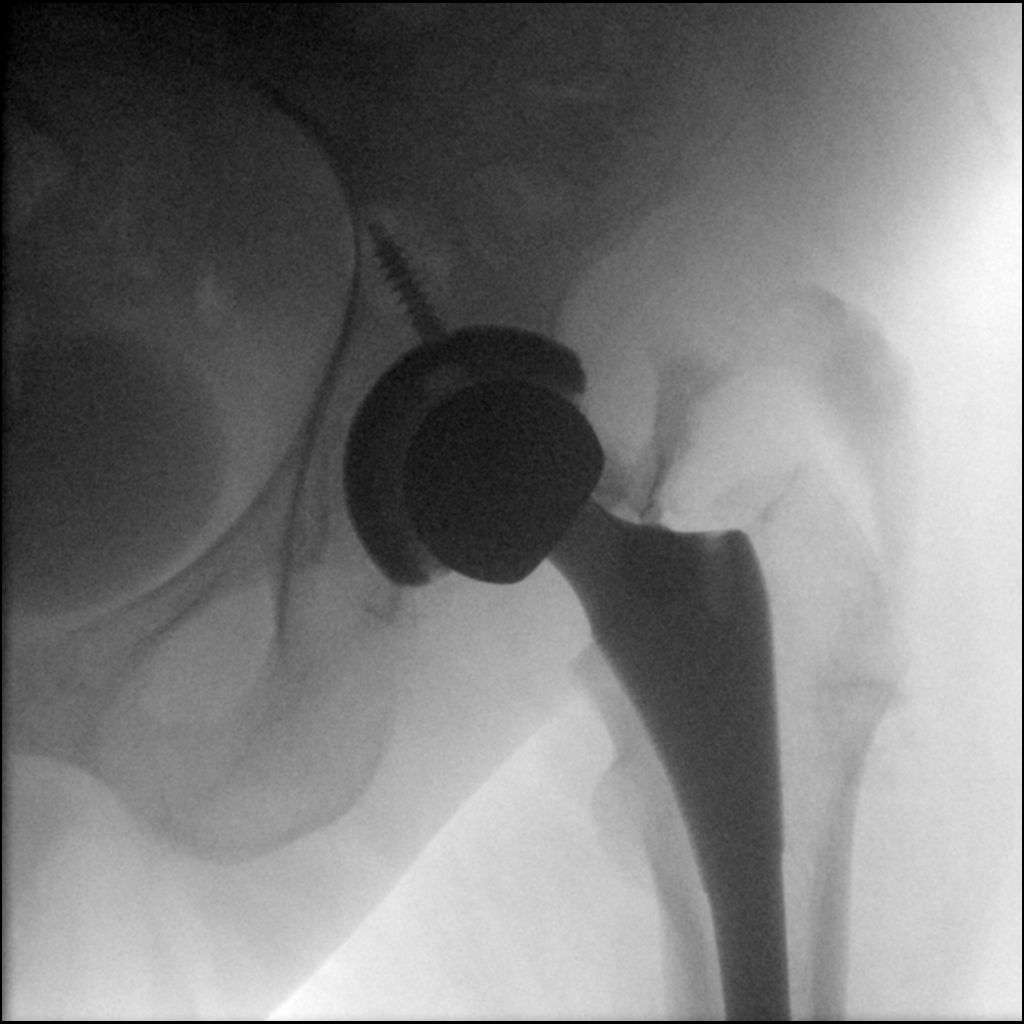

[1 of 1 positions shown; findings below may reference images not displayed]

EXAM:
LEFT HIP INJECTION UNDER FLUOROSCOPY

FLUOROSCOPY TIME:  Fluoroscopy Time:  30 seconds

Radiation Exposure Index (if provided by the fluoroscopic device): 5
mGy

PROCEDURE:
Overlying skin prepped with Betadine, draped in the usual sterile
fashion, and infiltrated locally with buffered Lidocaine. Curved 20
gauge spinal needle advanced to the superolateral margin of the left
femoral head with needle felt against the prosthesis. Fluid could
not be aspirated despite repositioning. Approximately 3 cc of
sterile saline was injected. There was no significant return of
fluid upon attempted aspiration. Approximately 5 cc of Omnipaque 300
was injected and needle was felt to be appropriately located in this
postoperative patient. No immediate complication.
IMPRESSION: Unsuccessful left hip aspiration under fluoroscopy.

## 2020-11-23 DIAGNOSIS — H9202 Otalgia, left ear: Secondary | ICD-10-CM | POA: Diagnosis not present

## 2020-11-23 DIAGNOSIS — Z1152 Encounter for screening for COVID-19: Secondary | ICD-10-CM | POA: Diagnosis not present

## 2020-11-23 DIAGNOSIS — H65192 Other acute nonsuppurative otitis media, left ear: Secondary | ICD-10-CM | POA: Diagnosis not present

## 2020-11-23 DIAGNOSIS — J069 Acute upper respiratory infection, unspecified: Secondary | ICD-10-CM | POA: Diagnosis not present

## 2020-11-23 DIAGNOSIS — R0981 Nasal congestion: Secondary | ICD-10-CM | POA: Diagnosis not present

## 2020-11-23 DIAGNOSIS — R051 Acute cough: Secondary | ICD-10-CM | POA: Diagnosis not present

## 2020-11-29 IMAGING — MG DIGITAL SCREENING BILAT W/ TOMO W/ CAD
3 series · 3 of 7 positions shown · non-contrast
Comparison: Previous exam(s).

CLINICAL DATA: Screening.

EXAM:
DIGITAL SCREENING BILATERAL MAMMOGRAM WITH TOMO AND CAD

[L CC synth-2D]
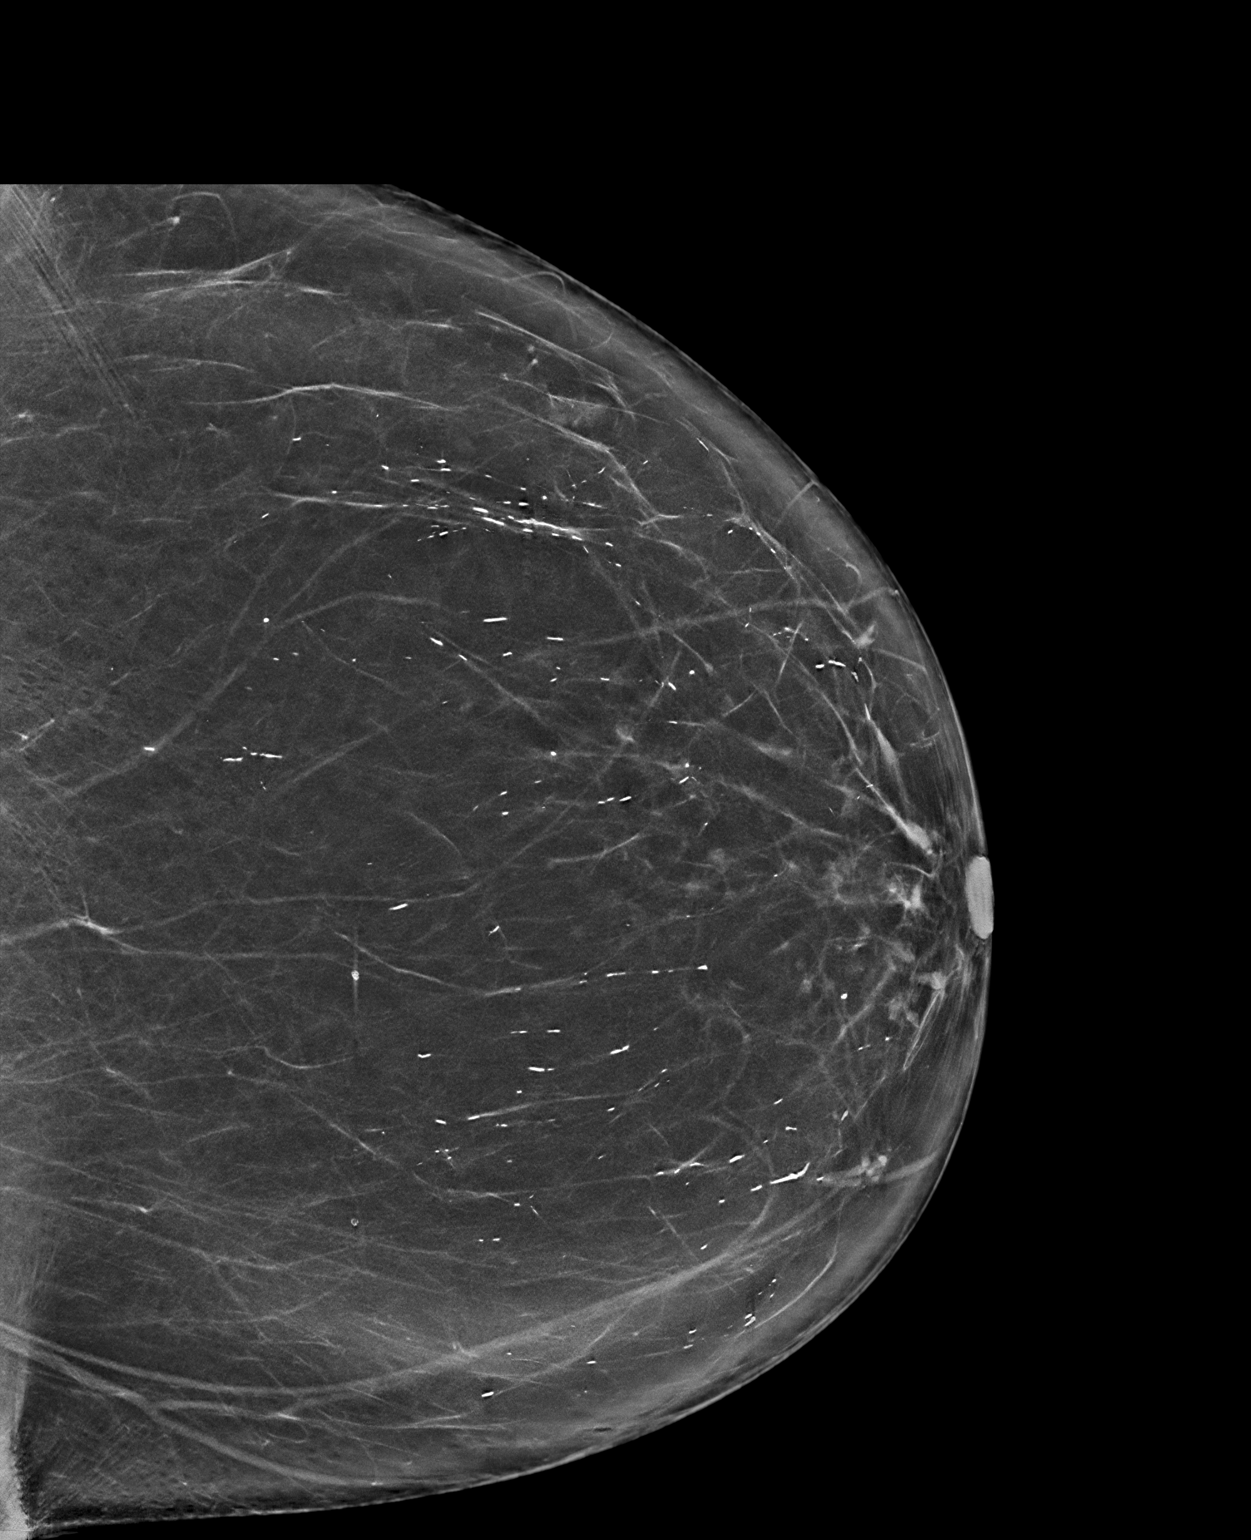

[R CC synth-2D]
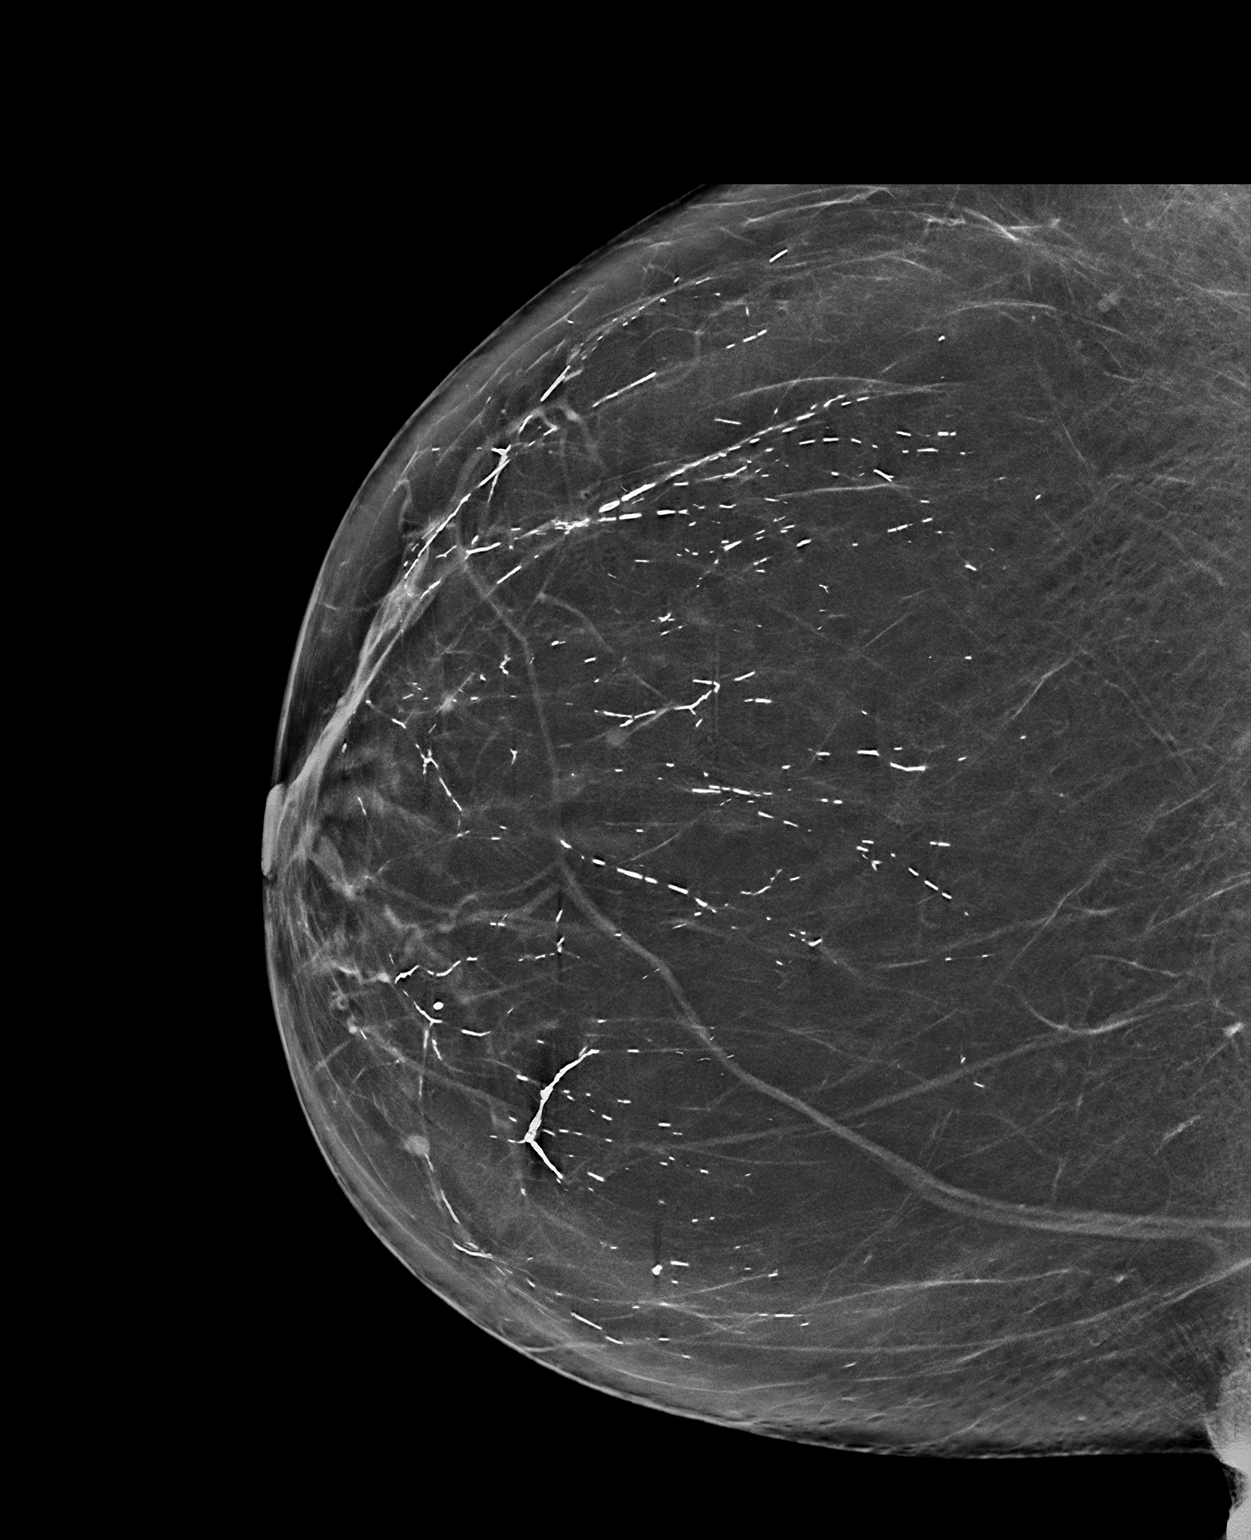

[L MLO tomo · tomo slice 45/90.0]
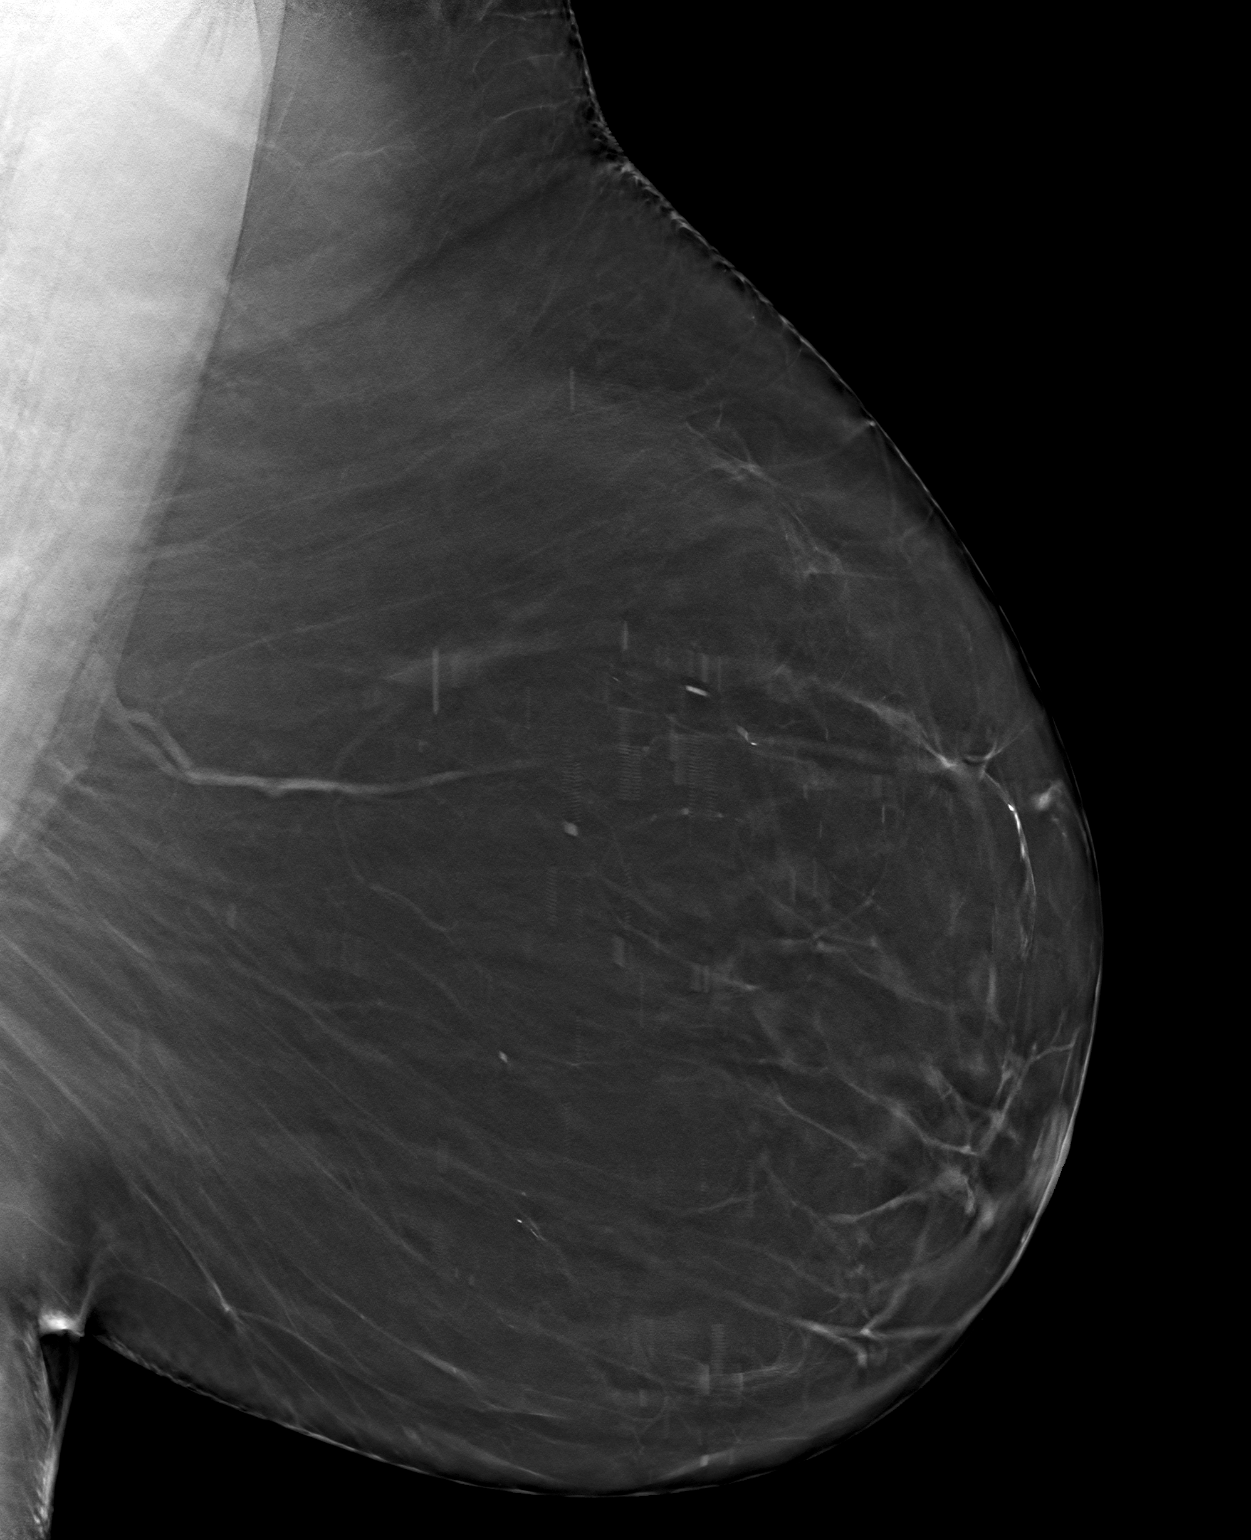

[3 of 7 positions shown; findings below may reference images not displayed]

ACR Breast Density Category b: There are scattered areas of
fibroglandular density.
FINDINGS: There are no findings suspicious for malignancy. Images were
processed with CAD.
IMPRESSION: No mammographic evidence of malignancy. A result letter of this
screening mammogram will be mailed directly to the patient.

RECOMMENDATION:
Screening mammogram in one year. (Code:[id number]-775)

BI-RADS CATEGORY  1: Negative.

## 2020-12-10 ENCOUNTER — Other Ambulatory Visit: Payer: Self-pay | Admitting: Internal Medicine

## 2020-12-10 DIAGNOSIS — Z1231 Encounter for screening mammogram for malignant neoplasm of breast: Secondary | ICD-10-CM

## 2020-12-11 DIAGNOSIS — H6982 Other specified disorders of Eustachian tube, left ear: Secondary | ICD-10-CM | POA: Diagnosis not present

## 2021-01-29 DIAGNOSIS — Z1231 Encounter for screening mammogram for malignant neoplasm of breast: Secondary | ICD-10-CM

## 2021-02-01 ENCOUNTER — Ambulatory Visit
Admission: RE | Admit: 2021-02-01 | Discharge: 2021-02-01 | Disposition: A | Discharge status: Home / Self Care | Payer: PPO | Source: Ambulatory Visit | Attending: Internal Medicine | Admitting: Internal Medicine

## 2021-02-01 ENCOUNTER — Other Ambulatory Visit: Payer: Self-pay

## 2021-02-01 DIAGNOSIS — Z1231 Encounter for screening mammogram for malignant neoplasm of breast: Secondary | ICD-10-CM | POA: Diagnosis not present

## 2021-03-02 DIAGNOSIS — Z23 Encounter for immunization: Secondary | ICD-10-CM | POA: Diagnosis not present

## 2021-05-20 DIAGNOSIS — I10 Essential (primary) hypertension: Secondary | ICD-10-CM | POA: Diagnosis not present

## 2021-05-20 DIAGNOSIS — R42 Dizziness and giddiness: Secondary | ICD-10-CM | POA: Diagnosis not present

## 2021-05-20 DIAGNOSIS — H6693 Otitis media, unspecified, bilateral: Secondary | ICD-10-CM | POA: Diagnosis not present

## 2021-05-20 DIAGNOSIS — J309 Allergic rhinitis, unspecified: Secondary | ICD-10-CM | POA: Diagnosis not present

## 2021-05-20 DIAGNOSIS — E119 Type 2 diabetes mellitus without complications: Secondary | ICD-10-CM | POA: Diagnosis not present

## 2021-07-11 DIAGNOSIS — E785 Hyperlipidemia, unspecified: Secondary | ICD-10-CM | POA: Diagnosis not present

## 2021-07-11 DIAGNOSIS — I10 Essential (primary) hypertension: Secondary | ICD-10-CM | POA: Diagnosis not present

## 2021-07-11 DIAGNOSIS — E119 Type 2 diabetes mellitus without complications: Secondary | ICD-10-CM | POA: Diagnosis not present

## 2021-07-12 DIAGNOSIS — M5136 Other intervertebral disc degeneration, lumbar region: Secondary | ICD-10-CM | POA: Diagnosis not present

## 2021-07-12 DIAGNOSIS — R739 Hyperglycemia, unspecified: Secondary | ICD-10-CM | POA: Diagnosis not present

## 2021-07-12 DIAGNOSIS — E785 Hyperlipidemia, unspecified: Secondary | ICD-10-CM | POA: Diagnosis not present

## 2021-07-12 DIAGNOSIS — K219 Gastro-esophageal reflux disease without esophagitis: Secondary | ICD-10-CM | POA: Diagnosis not present

## 2021-07-12 DIAGNOSIS — Z0000 Encounter for general adult medical examination without abnormal findings: Secondary | ICD-10-CM | POA: Diagnosis not present

## 2021-07-12 DIAGNOSIS — F5104 Psychophysiologic insomnia: Secondary | ICD-10-CM | POA: Diagnosis not present

## 2021-10-09 ENCOUNTER — Encounter: Payer: Self-pay | Admitting: Gastroenterology

## 2021-10-30 ENCOUNTER — Encounter: Payer: Self-pay | Admitting: Infectious Diseases

## 2021-11-14 ENCOUNTER — Ambulatory Visit (AMBULATORY_SURGERY_CENTER): Payer: Self-pay | Admitting: *Deleted

## 2021-11-14 VITALS — Ht 65.5 in | Wt 247.0 lb

## 2021-11-14 DIAGNOSIS — Z800 Family history of malignant neoplasm of digestive organs: Secondary | ICD-10-CM

## 2021-11-14 DIAGNOSIS — Z86010 Personal history of colonic polyps: Secondary | ICD-10-CM

## 2021-11-14 MED ORDER — NA SULFATE-K SULFATE-MG SULF 17.5-3.13-1.6 GM/177ML PO SOLN: 1.0000 | Freq: Once | ORAL | 0 refills | Status: AC

## 2021-11-14 NOTE — Progress Notes (Signed)
No egg or soy allergy known to patient   issues known to pt with past sedation with any surgeries or procedures of PONV  Patient denies ever being told they had issues or difficulty with intubation  No FH of Malignant Hyperthermia Pt is not on diet pills Pt is not on  home 02  Pt is not on blood thinners  Pt denies issues with constipation  No A fib or A flutter Have any cardiac testing pending--pt denies  Pt has a suprep kit at home

## 2021-11-29 ENCOUNTER — Encounter: Payer: Self-pay | Admitting: Gastroenterology

## 2021-12-05 ENCOUNTER — Encounter: Payer: Self-pay | Admitting: Gastroenterology

## 2021-12-05 ENCOUNTER — Ambulatory Visit (AMBULATORY_SURGERY_CENTER): Payer: PPO | Admitting: Gastroenterology

## 2021-12-05 VITALS — BP 132/73 | HR 75 | Temp 98.7°F | Resp 12 | Ht 65.5 in | Wt 247.0 lb

## 2021-12-05 DIAGNOSIS — Z800 Family history of malignant neoplasm of digestive organs: Secondary | ICD-10-CM

## 2021-12-05 DIAGNOSIS — Z86010 Personal history of colonic polyps: Secondary | ICD-10-CM | POA: Diagnosis not present

## 2021-12-05 DIAGNOSIS — D122 Benign neoplasm of ascending colon: Secondary | ICD-10-CM

## 2021-12-05 DIAGNOSIS — Z09 Encounter for follow-up examination after completed treatment for conditions other than malignant neoplasm: Secondary | ICD-10-CM

## 2021-12-05 MED ORDER — SODIUM CHLORIDE 0.9 % IV SOLN: 500.0000 mL | INTRAVENOUS | Status: AC

## 2021-12-05 NOTE — Op Note (Signed)
[facility] Patient Name: Wendy Ray Procedure Date: 12/05/2021 9:53 AM MRN: [id number] Endoscopist: Mauri Pole , MD Age: 71 Referring MD:  Date of Birth: 01/30/1951 Gender: Female Account #: [id number] Procedure:                Colonoscopy Indications:              Screening in patient at increased risk: Family                            history of 1st-degree relative with colorectal                            cancer, High risk colon cancer surveillance:                            Personal history of adenoma (10 mm or greater in                            size) Medicines:                Monitored Anesthesia Care Procedure:                Pre-Anesthesia Assessment:                           - Prior to the procedure, a History and Physical                            was performed, and patient medications and                            allergies were reviewed. The patient's tolerance of                            previous anesthesia was also reviewed. The risks                            and benefits of the procedure and the sedation                            options and risks were discussed with the patient.                            All questions were answered, and informed consent                            was obtained. Prior Anticoagulants: The patient has                            taken no previous anticoagulant or antiplatelet                            agents. ASA Grade Assessment: III - A patient with  severe systemic disease. After reviewing the risks                            and benefits, the patient was deemed in                            satisfactory condition to undergo the procedure.                           After obtaining informed consent, the colonoscope                            was passed under direct vision. Throughout the                            procedure, the patient's blood pressure, pulse, and                             oxygen saturations were monitored continuously. The                            PCF-HQ190L Colonoscope was introduced through the                            anus and advanced to the the cecum, identified by                            appendiceal orifice and ileocecal valve. The                            colonoscopy was performed without difficulty. The                            patient tolerated the procedure well. The quality                            of the bowel preparation was good. The ileocecal                            valve, appendiceal orifice, and rectum were                            photographed. Scope In: 10:01:44 AM Scope Out: 10:20:25 AM Scope Withdrawal Time: 0 hours 13 minutes 20 seconds  Total Procedure Duration: 0 hours 18 minutes 41 seconds  Findings:                 The perianal and digital rectal examinations were                            normal.                           A 5 mm polyp was found in the ascending colon. The  polyp was sessile. The polyp was removed with a                            cold snare. Resection and retrieval were complete.                           Scattered small-mouthed diverticula were found in                            the sigmoid colon, descending colon, transverse                            colon and ascending colon.                           Non-bleeding external and internal hemorrhoids were                            found during retroflexion. The hemorrhoids were                            medium-sized. Complications:            No immediate complications. Estimated Blood Loss:     Estimated blood loss was minimal. Impression:               - One 5 mm polyp in the ascending colon, removed                            with a cold snare. Resected and retrieved.                           - Diverticulosis in the sigmoid colon, in the                            descending colon, in the  transverse colon and in                            the ascending colon.                           - Non-bleeding external and internal hemorrhoids. Recommendation:           - Patient has a contact number available for                            emergencies. The signs and symptoms of potential                            delayed complications were discussed with the                            patient. Return to normal activities tomorrow.                            Written discharge instructions were  provided to the                            patient.                           - Resume previous diet.                           - Continue present medications.                           - Await pathology results.                           - Repeat colonoscopy in 5 years for surveillance                            based on pathology results. Mauri Pole, MD 12/05/2021 10:29:24 AM This report has been signed electronically.

## 2021-12-05 NOTE — Patient Instructions (Signed)
YOU HAD AN ENDOSCOPIC PROCEDURE TODAY AT [facility]:   Refer to the procedure report that was given to you for any specific questions about what was found during the examination.  If the procedure report does not answer your questions, please call your gastroenterologist to clarify.  If you requested that your care partner not be given the details of your procedure findings, then the procedure report has been included in a sealed envelope for you to review at your convenience later.  YOU SHOULD EXPECT: Some feelings of bloating in the abdomen. Passage of more gas than usual.  Walking can help get rid of the air that was put into your GI tract during the procedure and reduce the bloating. If you had a lower endoscopy (such as a colonoscopy or flexible sigmoidoscopy) you may notice spotting of blood in your stool or on the toilet paper. If you underwent a bowel prep for your procedure, you may not have a normal bowel movement for a few days.  Please Note:  You might notice some irritation and congestion in your nose or some drainage.  This is from the oxygen used during your procedure.  There is no need for concern and it should clear up in a day or so.  SYMPTOMS TO REPORT IMMEDIATELY:  Following lower endoscopy (colonoscopy or flexible sigmoidoscopy):  Excessive amounts of blood in the stool  Significant tenderness or worsening of abdominal pains  Swelling of the abdomen that is new, acute  Fever of 100F or higher   For urgent or emergent issues, a gastroenterologist can be reached at any hour by calling [phone]. Do not use MyChart messaging for urgent concerns.    DIET:  We do recommend a small meal at first, but then you may proceed to your regular diet.  Drink plenty of fluids but you should avoid alcoholic beverages for 24 hours.  MEDICATIONS: Continue present medications.  Please see handouts given to you by your recovery nurse.  Thank you for allowing [place] to  provide for your healthcare needs today.  ACTIVITY:  You should plan to take it easy for the rest of today and you should NOT DRIVE or use heavy machinery until tomorrow (because of the sedation medicines used during the test).    FOLLOW UP: Our staff will call the number listed on your records the next business day following your procedure.  We will call around 7:15- 8:00 am to check on you and address any questions or concerns that you may have regarding the information given to you following your procedure. If we do not reach you, we will leave a message.  If you develop any symptoms (ie: fever, flu-like symptoms, shortness of breath, cough etc.) before then, please call [phone].  If you test positive for Covid 19 in the 2 weeks post procedure, please call and report this information to [place].    If any biopsies were taken you will be contacted by phone or by letter within the next 1-3 weeks.  Please call us at [phone] if you have not heard about the biopsies in 3 weeks.    SIGNATURES/CONFIDENTIALITY: You and/or your care partner have signed paperwork which will be entered into your electronic medical record.  These signatures attest to the fact that that the information above on your After Visit Summary has been reviewed and is understood.  Full responsibility of the confidentiality of this discharge information lies with you and/or your care-partner.

## 2021-12-05 NOTE — Progress Notes (Signed)
Called to room to assist during endoscopic procedure.  Patient ID and intended procedure confirmed with present staff. Received instructions for my participation in the procedure from the performing physician.  

## 2021-12-05 NOTE — Progress Notes (Signed)
VSS, transported to PACU °

## 2021-12-05 NOTE — Progress Notes (Signed)
[facility] History and Physical   Primary Care Physician:  Donnajean Lopes, MD   Reason for Procedure:  History of adenomatous colon polyps, family h/o colon cancer  Plan:    Surveillance colonoscopy with possible interventions as needed     HPI: Wendy Ray is a very pleasant 71 y.o. female here for surveillance colonoscopy. Denies any nausea, vomiting, abdominal pain, melena or bright red blood per rectum  The risks and benefits as well as alternatives of endoscopic procedure(s) have been discussed and reviewed. All questions answered. The patient agrees to proceed.    Past Medical History:  Diagnosis Date   Allergy    mild   Arthritis    Blood transfusion without reported diagnosis    possibly with hip infection 06/2019   Cancer ([place])    squamous cell chest - skin   Hyperlipidemia    Hypertension    Infection of left prosthetic hip joint ([place]) 07/11/2019   PONV (postoperative nausea and vomiting)    Primary localized osteoarthritis of right hip 01/22/2016    Past Surgical History:  Procedure Laterality Date   BACK SURGERY  2003   CERVICAL DISCECTOMY     COLONOSCOPY     INCISION AND DRAINAGE HIP Left 07/07/2019   Procedure: DEBRIDEMENT OF LEFT HIP WITH HEAD BALL LINER EXCHANGE;  Surgeon: Rod Can, MD;  Location: [facility];  Service: Orthopedics;  Laterality: Left;   JOINT REPLACEMENT     POLYPECTOMY     TONSILLECTOMY     TOTAL HIP ARTHROPLASTY Right 01/22/2016   Procedure: RIGHT TOTAL HIP ARTHROPLASTY;  Surgeon: Marchia Bond, MD;  Location: [facility];  Service: Orthopedics;  Laterality: Right;   TOTAL HIP ARTHROPLASTY Left 05/17/2019   Procedure: TOTAL HIP ARTHROPLASTY;  Surgeon: Marchia Bond, MD;  Location: [facility];  Service: Orthopedics;  Laterality: Left;    Prior to Admission medications   Medication Sig Start Date End Date Taking? Authorizing Provider  amLODipine (NORVASC) 5 MG tablet Take 5 mg by mouth daily. 02/10/19  Yes [provider]  aspirin 81 MG chewable tablet CHEW 1 TABLET BY MOUTH 2 (TWO) TIMES DAILY WITH A MEAL.   Yes [provider]  cetirizine (ZYRTEC) 10 MG tablet Take 10 mg by mouth daily. At bedtime   Yes [provider]  losartan-hydrochlorothiazide (HYZAAR) 100-12.5 MG tablet Take 1 tablet by mouth daily. 11/08/21  Yes [provider]  pantoprazole (PROTONIX) 20 MG tablet Take 20 mg by mouth daily as needed for heartburn.  07/01/19  Yes [provider]  pravastatin (PRAVACHOL) 20 MG tablet Take 20 mg by mouth daily. 02/09/19  Yes [provider]  meclizine (ANTIVERT) 25 MG tablet Take 25 mg by mouth 3 (three) times daily as needed. Patient not taking: Reported on 11/14/2021 05/20/21   [provider]  meloxicam (MOBIC) 15 MG tablet Take 15 mg by mouth daily as needed for pain.  06/06/19   [provider]    Current Outpatient Medications  Medication Sig Dispense Refill   amLODipine (NORVASC) 5 MG tablet Take 5 mg by mouth daily.     aspirin 81 MG chewable tablet CHEW 1 TABLET BY MOUTH 2 (TWO) TIMES DAILY WITH A MEAL.     cetirizine (ZYRTEC) 10 MG tablet Take 10 mg by mouth daily. At bedtime     losartan-hydrochlorothiazide (HYZAAR) 100-12.5 MG tablet Take 1 tablet by mouth daily.     pantoprazole (PROTONIX) 20 MG tablet Take 20 mg by mouth daily  as needed for heartburn.      pravastatin (PRAVACHOL) 20 MG tablet Take 20 mg by mouth daily.     meclizine (ANTIVERT) 25 MG tablet Take 25 mg by mouth 3 (three) times daily as needed. (Patient not taking: Reported on 11/14/2021)     meloxicam (MOBIC) 15 MG tablet Take 15 mg by mouth daily as needed for pain.      Current Facility-Administered Medications  Medication Dose Route Frequency Provider Last Rate Last Admin   0.9 %  sodium chloride infusion  500 mL Intravenous Continuous Gage Weant, Venia Minks, MD        Allergies as of 12/05/2021   (No Known Allergies)    Family History  Problem  Relation Age of Onset   Colon cancer Mother        in her 59's   Prostate cancer Father    Colon polyps Sister    Esophageal cancer Neg Hx    Rectal cancer Neg Hx    Stomach cancer Neg Hx     Social History   Socioeconomic History   Marital status: Widowed    Spouse name: Not on file   Number of children: Not on file   Years of education: Not on file   Highest education level: Not on file  Occupational History   Not on file  Tobacco Use   Smoking status: Never   Smokeless tobacco: Never  Vaping Use   Vaping Use: Never used  Substance and Sexual Activity   Alcohol use: Yes    Comment: occasionally   Drug use: No   Sexual activity: Not on file  Other Topics Concern   Not on file  Social History Narrative   Not on file   Social Determinants of Health   Financial Resource Strain: Not on file  Food Insecurity: Not on file  Transportation Needs: Not on file  Physical Activity: Not on file  Stress: Not on file  Social Connections: Not on file  Intimate Partner Violence: Not on file    Review of Systems:  All other review of systems negative except as mentioned in the HPI.  Physical Exam: Vital signs in last 24 hours: BP (!) 152/74   Pulse 74   Temp 98.7 F (37.1 C) (Temporal)   Ht 5' 5.5" (1.664 m)   Wt 247 lb (112 kg)   SpO2 95%   BMI 40.48 kg/m  General:   Alert, NAD Lungs:  Clear .   Heart:  Regular rate and rhythm Abdomen:  Soft, nontender and nondistended. Neuro/Psych:  Alert and cooperative. Normal mood and affect. A and O x 3  Reviewed labs, radiology imaging, old records and pertinent past GI work up  Patient is appropriate for planned procedure(s) and anesthesia in an ambulatory setting   K. Denzil Magnuson , MD [phone]

## 2021-12-05 NOTE — Progress Notes (Signed)
Pt's states no medical or surgical changes since previsit or office visit. 

## 2021-12-06 ENCOUNTER — Telehealth: Payer: Self-pay

## 2021-12-06 NOTE — Telephone Encounter (Signed)
  Follow up Call-     12/05/2021    8:57 AM  Call back number  Post procedure Call Back phone  # [phone]  Permission to leave phone message Yes     Patient questions:  Do you have a fever, pain , or abdominal swelling? No. Pain Score  0 *  Have you tolerated food without any problems? Yes.    Have you been able to return to your normal activities? Yes.    Do you have any questions about your discharge instructions: Diet   No. Medications  No. Follow up visit  No.  Do you have questions or concerns about your Care? No.  Actions: * If pain score is 4 or above: No action needed, pain <4.

## 2022-01-07 ENCOUNTER — Encounter: Payer: Self-pay | Admitting: Gastroenterology

## 2022-02-18 ENCOUNTER — Other Ambulatory Visit: Payer: Self-pay | Admitting: Internal Medicine

## 2022-02-18 DIAGNOSIS — Z1231 Encounter for screening mammogram for malignant neoplasm of breast: Secondary | ICD-10-CM

## 2022-02-21 ENCOUNTER — Ambulatory Visit
Admission: RE | Admit: 2022-02-21 | Discharge: 2022-02-21 | Disposition: A | Discharge status: Home / Self Care | Payer: PPO | Source: Ambulatory Visit | Attending: Internal Medicine | Admitting: Internal Medicine

## 2022-02-21 DIAGNOSIS — Z1231 Encounter for screening mammogram for malignant neoplasm of breast: Secondary | ICD-10-CM | POA: Diagnosis not present

## 2022-03-01 DIAGNOSIS — Z23 Encounter for immunization: Secondary | ICD-10-CM | POA: Diagnosis not present

## 2022-09-11 DIAGNOSIS — E785 Hyperlipidemia, unspecified: Secondary | ICD-10-CM | POA: Diagnosis not present

## 2022-09-11 DIAGNOSIS — E119 Type 2 diabetes mellitus without complications: Secondary | ICD-10-CM | POA: Diagnosis not present

## 2022-09-11 DIAGNOSIS — K219 Gastro-esophageal reflux disease without esophagitis: Secondary | ICD-10-CM | POA: Diagnosis not present

## 2022-09-18 DIAGNOSIS — R0789 Other chest pain: Secondary | ICD-10-CM | POA: Diagnosis not present

## 2022-09-18 DIAGNOSIS — R82998 Other abnormal findings in urine: Secondary | ICD-10-CM | POA: Diagnosis not present

## 2022-09-18 DIAGNOSIS — M5136 Other intervertebral disc degeneration, lumbar region: Secondary | ICD-10-CM | POA: Diagnosis not present

## 2022-09-18 DIAGNOSIS — Z0000 Encounter for general adult medical examination without abnormal findings: Secondary | ICD-10-CM | POA: Diagnosis not present

## 2022-09-18 DIAGNOSIS — Z1339 Encounter for screening examination for other mental health and behavioral disorders: Secondary | ICD-10-CM | POA: Diagnosis not present

## 2022-09-18 DIAGNOSIS — K219 Gastro-esophageal reflux disease without esophagitis: Secondary | ICD-10-CM | POA: Diagnosis not present

## 2022-09-18 DIAGNOSIS — R739 Hyperglycemia, unspecified: Secondary | ICD-10-CM | POA: Diagnosis not present

## 2022-09-18 DIAGNOSIS — Z1331 Encounter for screening for depression: Secondary | ICD-10-CM | POA: Diagnosis not present

## 2022-09-18 DIAGNOSIS — E785 Hyperlipidemia, unspecified: Secondary | ICD-10-CM | POA: Diagnosis not present

## 2022-09-18 DIAGNOSIS — F5104 Psychophysiologic insomnia: Secondary | ICD-10-CM | POA: Diagnosis not present

## 2022-11-07 NOTE — Progress Notes (Signed)
CARDIOLOGY CONSULT NOTE       Patient ID: Wendy Ray MRN: [id number] DOB/AGE: Oct 07, 1950 72 y.o.  Admit date: (Not on file) Referring Physician: Eloise Harman Primary Physician: Garlan Fillers, MD Primary Cardiologist: New Reason for Consultation: Chest pain   HPI:  72 y.o. referred by Dr Eloise Harman for chest pain. History of HTN and HLD Widowed in 2020. 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 Reflux with diet and being overweight She takes protonix for GERD  Labs ok with normal renal function Hct 46.2 LDL 93 A1c 5.5   She has not had prior ischemic w/u or stress testing  She has a strong family history of premature CAD and PAF. She does swim twice/week and shuttles grand kids around but otherwise fairly sedentary  ROS All other systems reviewed and negative except as noted above  Past Medical History:  Diagnosis Date   Allergy    mild   Arthritis    Blood transfusion without reported diagnosis    possibly with hip infection 06/2019   Cancer (HCC)    squamous cell chest - skin   Dysuria    GERD (gastroesophageal reflux disease)    Hyperlipidemia    Hypertension    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) 07/11/2019   Insomnia    Low back pain    Osteoarthritis of lumbar spine    Overweight    PONV (postoperative nausea and vomiting)    Primary localized osteoarthritis of right hip 01/22/2016   Shingles    Weight gain     Family History  Problem Relation Age of Onset   Colon cancer Mother        in her 28's   Prostate cancer Father    Colon polyps Sister    Esophageal cancer Neg Hx    Rectal cancer Neg Hx    Stomach cancer Neg Hx     Social History   Socioeconomic History   Marital status: Widowed    Spouse name: Not on file   Number of children: Not on file   Years of education: Not on file   Highest  education level: Not on file  Occupational History   Not on file  Tobacco Use   Smoking status: Never   Smokeless tobacco: Never  Vaping Use   Vaping status: Never Used  Substance and Sexual Activity   Alcohol use: Yes    Comment: occasionally   Drug use: No   Sexual activity: Not on file  Other Topics Concern   Not on file  Social History Narrative   Not on file   Social Determinants of Health   Financial Resource Strain: Not on file  Food Insecurity: Not on file  Transportation Needs: Not on file  Physical Activity: Not on file  Stress: Not on file  Social Connections: Not on file  Intimate Partner Violence: Not on file    Past Surgical History:  Procedure Laterality Date   BACK SURGERY  2003   CERVICAL DISCECTOMY     COLONOSCOPY     INCISION AND DRAINAGE HIP Left 07/07/2019   Procedure: DEBRIDEMENT OF LEFT HIP WITH HEAD BALL LINER EXCHANGE;  Surgeon: Samson Frederic, MD;  Location: [facility];  Service: Orthopedics;  Laterality: Left;   JOINT REPLACEMENT     POLYPECTOMY     TONSILLECTOMY  TOTAL HIP ARTHROPLASTY Right 01/22/2016   Procedure: RIGHT TOTAL HIP ARTHROPLASTY;  Surgeon: Teryl Lucy, MD;  Location: [facility];  Service: Orthopedics;  Laterality: Right;   TOTAL HIP ARTHROPLASTY Left 05/17/2019   Procedure: TOTAL HIP ARTHROPLASTY;  Surgeon: Teryl Lucy, MD;  Location: [facility];  Service: Orthopedics;  Laterality: Left;      Current Outpatient Medications:    amLODipine (NORVASC) 5 MG tablet, Take 5 mg by mouth daily., Disp: , Rfl:    aspirin 81 MG chewable tablet, CHEW 1 TABLET BY MOUTH 2 (TWO) TIMES DAILY WITH A MEAL., Disp: , Rfl:    cetirizine (ZYRTEC) 10 MG tablet, Take 10 mg by mouth daily. At bedtime, Disp: , Rfl:    fluticasone (FLONASE) 50 MCG/ACT nasal spray, 1 spray in each nostril Nasally Twice a day, Disp: , Rfl:    losartan-hydrochlorothiazide (HYZAAR) 100-12.5 MG tablet, Take 1 tablet by mouth daily., Disp: , Rfl:    meloxicam (MOBIC) 15 MG  tablet, Take 15 mg by mouth daily as needed for pain. , Disp: , Rfl:    pantoprazole (PROTONIX) 20 MG tablet, Take 20 mg by mouth daily as needed for heartburn. , Disp: , Rfl:    pravastatin (PRAVACHOL) 20 MG tablet, Take 20 mg by mouth daily., Disp: , Rfl:    scopolamine (TRANSDERM-SCOP) 1 MG/3DAYS, Place 1 patch onto the skin as needed., Disp: , Rfl:   Current Facility-Administered Medications:    0.9 %  sodium chloride infusion, 500 mL, Intravenous, Continuous, Nandigam, Kavitha V, MD   sodium chloride      Physical Exam: Blood pressure 126/84, pulse 76, height 5\' 6"  (1.676 m), weight 243 lb 6.4 oz (110.4 kg), SpO2 97%.    Affect appropriate Healthy:  appears stated age HEENT: normal Neck supple with no adenopathy JVP normal no bruits no thyromegaly Lungs clear with no wheezing and good diaphragmatic motion Heart:  S1/S2 no murmur, no rub, gallop or click PMI normal Abdomen: benighn, BS positve, no tenderness, no AAA no bruit.  No HSM or HJR Distal pulses intact with no bruits No edema Neuro non-focal Skin warm and dry No muscular weakness   Labs:   Lab Results  Component Value Date   WBC 6.6 09/07/2019   HGB 13.6 09/07/2019   HCT 42.0 09/07/2019   MCV 91.9 09/07/2019   PLT 227 09/07/2019   No results for input(s): "NA", "K", "CL", "CO2", "BUN", "CREATININE", "CALCIUM", "PROT", "BILITOT", "ALKPHOS", "ALT", "AST", "GLUCOSE" in the last 168 hours.  Invalid input(s): "LABALBU" No results found for: "CKTOTAL", "CKMB", "CKMBINDEX", "TROPONINI" No results found for: "CHOL" No results found for: "HDL" No results found for: "LDLCALC" No results found for: "TRIG" No results found for: "CHOLHDL" No results found for: "LDLDIRECT"    Radiology: No results found.  EKG: SR PR 214 msec nonspecific ST changes    ASSESSMENT AND PLAN:   Chest Pain: atypical non exertional In setting of age, HTN and HLD shared decision making favor cardiac CTA to risk stratify BMET today  Lopressor 100 mg 2 hours before study HTN:  continue norvasc and Hyzaar HLD  continue statin labs with primary LDL < 100 GERD:  discussed low carb diet and weight loss Protonix  CTA Lopressor 100 mg 2 hours prior to study BMET  F/U in a year pending CTA results   Signed: Charlton Haws 11/20/2022, 11:20 AM

## 2022-11-20 ENCOUNTER — Encounter: Payer: Self-pay | Admitting: Cardiovascular Disease

## 2022-11-20 ENCOUNTER — Ambulatory Visit: Payer: PPO | Attending: Cardiovascular Disease | Admitting: Cardiovascular Disease

## 2022-11-20 VITALS — BP 126/84 | HR 76 | Ht 66.0 in | Wt 243.4 lb

## 2022-11-20 DIAGNOSIS — E782 Mixed hyperlipidemia: Secondary | ICD-10-CM

## 2022-11-20 DIAGNOSIS — R072 Precordial pain: Secondary | ICD-10-CM

## 2022-11-20 DIAGNOSIS — I10 Essential (primary) hypertension: Secondary | ICD-10-CM | POA: Diagnosis not present

## 2022-11-20 DIAGNOSIS — R079 Chest pain, unspecified: Secondary | ICD-10-CM

## 2022-11-20 LAB — BASIC METABOLIC PANEL
BUN/Creatinine Ratio: 19 (ref 12–28)
BUN: 18 mg/dL (ref 8–27)
CO2: 23 mmol/L (ref 20–29)
Calcium: 10 mg/dL (ref 8.7–10.3)
Chloride: 104 mmol/L (ref 96–106)
Creatinine, Ser: 0.96 mg/dL (ref 0.57–1.00)
Glucose: 98 mg/dL (ref 70–99)
Potassium: 4.7 mmol/L (ref 3.5–5.2)
Sodium: 143 mmol/L (ref 134–144)
eGFR: 62 mL/min/{1.73_m2} (ref >59)

## 2022-11-20 MED ORDER — METOPROLOL TARTRATE 100 MG PO TABS: 100.0000 mg | ORAL_TABLET | Freq: Once | ORAL | 0 refills | Status: AC

## 2022-11-20 NOTE — Patient Instructions (Addendum)
Medication Instructions:  Your physician recommends that you continue on your current medications as directed. Please refer to the Current Medication list given to you today.  *If you need a refill on your cardiac medications before your next appointment, please call your pharmacy*  Lab Work: Your physician recommends that you have lab work today- BMET  If you have labs (blood work) drawn today and your tests are completely normal, you will receive your results only by: MyChart Message (if you have MyChart) OR A paper copy in the mail If you have any lab test that is abnormal or we need to change your treatment, we will call you to review the results.  Testing/Procedures: Your physician has requested that you have cardiac CT. Cardiac computed tomography (CT) is a painless test that uses an x-ray machine to take clear, detailed pictures of your heart. For further information please visit [email or web address] Please follow instruction sheet as given.   Follow-Up: At [facility], you and your health needs are our priority.  As part of our continuing mission to provide you with exceptional heart care, we have created designated Provider Care Teams.  These Care Teams include your primary Cardiologist (physician) and Advanced Practice Providers (APPs -  Physician Assistants and Nurse Practitioners) who all work together to provide you with the care you need, when you need it.  We recommend signing up for the patient portal called "MyChart".  Sign up information is provided on this After Visit Summary.  MyChart is used to connect with patients for Virtual Visits (Telemedicine).  Patients are able to view lab/test results, encounter notes, upcoming appointments, etc.  Non-urgent messages can be sent to your provider as well.   To learn more about what you can do with MyChart, go to [email or web address].    Your next appointment:   12 month(s)  Provider:   Charlton Haws, MD     Other  Instructions   Your cardiac CT will be scheduled at one of the below locations:   [facility] [address] [place], [place] [address] [phone]   If scheduled at [facility], please arrive at the [facility] and Children's Entrance (Entrance C2) of [facility] 30 minutes prior to test start time. You can use the FREE valet parking offered at entrance C (encouraged to control the heart rate for the test)  Proceed to the [facility] Radiology Department (first floor) to check-in and test prep.  All radiology patients and guests should use entrance C2 at [facility], accessed from [facility], even though the hospital's physical address listed is [address].      Please follow these instructions carefully (unless otherwise directed):  On the Night Before the Test: Be sure to Drink plenty of water. Do not consume any caffeinated/decaffeinated beverages or chocolate 12 hours prior to your test. Do not take any antihistamines 12 hours prior to your test.  On the Day of the Test: Drink plenty of water until 1 hour prior to the test. Do not eat any food 1 hour prior to test. You may take your regular medications prior to the test.  Take metoprolol (Lopressor) 100 mg two hours prior to test. If you take Losartan/Hydrochlorothiazide please HOLD on the morning of the test. FEMALES- please wear underwire-free bra if available, avoid dresses & tight clothing      After the Test: Drink plenty of water. After receiving IV contrast, you may experience a mild flushed  feeling. This is normal. On occasion, you may experience a mild rash up to 24 hours after the test. This is not dangerous. If this occurs, you can take Benadryl 25 mg and increase your fluid intake. If you experience trouble breathing, this can be serious. If it is severe call 911 IMMEDIATELY. If it is mild, please call our office.  We will call to schedule your test  2-4 weeks out understanding that some insurance companies will need an authorization prior to the service being performed.   For more information and frequently asked questions, please visit our website : [email or web address]  For non-scheduling related questions, please contact the cardiac imaging nurse navigator should you have any questions/concerns: Rockwell Alexandria, Cardiac Imaging Nurse Navigator Larey Brick, Cardiac Imaging Nurse Navigator [facility] Direct Office Dial: [phone]   For scheduling needs, including cancellations and rescheduling, please call [place], [phone].

## 2022-11-27 ENCOUNTER — Ambulatory Visit (HOSPITAL_COMMUNITY)
Admission: RE | Admit: 2022-11-27 | Discharge: 2022-11-27 | Disposition: A | Discharge status: Home / Self Care | Payer: PPO | Source: Ambulatory Visit | Attending: Cardiovascular Disease | Admitting: Cardiovascular Disease

## 2022-11-27 DIAGNOSIS — R079 Chest pain, unspecified: Secondary | ICD-10-CM | POA: Diagnosis not present

## 2022-11-27 DIAGNOSIS — R072 Precordial pain: Secondary | ICD-10-CM | POA: Diagnosis not present

## 2022-11-27 MED ORDER — NITROGLYCERIN 0.4 MG SL SUBL
0.8000 mg | SUBLINGUAL_TABLET | SUBLINGUAL | Status: DC | PRN
  Administered 2022-11-27: 0.8 mg via SUBLINGUAL

## 2022-11-27 MED ORDER — IOHEXOL 350 MG/ML SOLN
95.0000 mL | Freq: Once | INTRAVENOUS | Status: AC | PRN
  Administered 2022-11-27: 95 mL via INTRAVENOUS

## 2022-11-27 MED ORDER — NITROGLYCERIN 0.4 MG SL SUBL
SUBLINGUAL_TABLET | SUBLINGUAL | Status: AC
  Filled 2022-11-27: qty 2

## 2022-11-27 NOTE — Progress Notes (Signed)
Pt verbalized understanding of discharge instruction; opportunity for questions provided ?

## 2023-01-13 DIAGNOSIS — Z85828 Personal history of other malignant neoplasm of skin: Secondary | ICD-10-CM | POA: Diagnosis not present

## 2023-01-13 DIAGNOSIS — L570 Actinic keratosis: Secondary | ICD-10-CM | POA: Diagnosis not present

## 2023-01-13 DIAGNOSIS — L814 Other melanin hyperpigmentation: Secondary | ICD-10-CM | POA: Diagnosis not present

## 2023-01-13 DIAGNOSIS — D225 Melanocytic nevi of trunk: Secondary | ICD-10-CM | POA: Diagnosis not present

## 2023-01-13 DIAGNOSIS — L821 Other seborrheic keratosis: Secondary | ICD-10-CM | POA: Diagnosis not present

## 2023-03-07 DIAGNOSIS — Z23 Encounter for immunization: Secondary | ICD-10-CM | POA: Diagnosis not present

## 2023-03-19 ENCOUNTER — Other Ambulatory Visit: Payer: Self-pay | Admitting: Internal Medicine

## 2023-03-19 DIAGNOSIS — Z0000 Encounter for general adult medical examination without abnormal findings: Secondary | ICD-10-CM

## 2023-03-23 ENCOUNTER — Ambulatory Visit
Admission: RE | Admit: 2023-03-23 | Discharge: 2023-03-23 | Disposition: A | Discharge status: Home / Self Care | Payer: PPO | Source: Ambulatory Visit | Attending: Internal Medicine | Admitting: Internal Medicine

## 2023-03-23 DIAGNOSIS — Z1231 Encounter for screening mammogram for malignant neoplasm of breast: Secondary | ICD-10-CM | POA: Diagnosis not present

## 2023-03-23 DIAGNOSIS — Z0000 Encounter for general adult medical examination without abnormal findings: Secondary | ICD-10-CM

## 2023-03-26 ENCOUNTER — Other Ambulatory Visit: Payer: Self-pay | Admitting: Internal Medicine

## 2023-03-26 DIAGNOSIS — R928 Other abnormal and inconclusive findings on diagnostic imaging of breast: Secondary | ICD-10-CM

## 2023-04-13 ENCOUNTER — Ambulatory Visit: Payer: PPO

## 2023-04-13 ENCOUNTER — Ambulatory Visit
Admission: RE | Admit: 2023-04-13 | Discharge: 2023-04-13 | Disposition: A | Discharge status: Home / Self Care | Payer: PPO | Source: Ambulatory Visit | Attending: Internal Medicine | Admitting: Internal Medicine

## 2023-04-13 DIAGNOSIS — R928 Other abnormal and inconclusive findings on diagnostic imaging of breast: Secondary | ICD-10-CM | POA: Diagnosis not present

## 2023-10-27 DIAGNOSIS — K219 Gastro-esophageal reflux disease without esophagitis: Secondary | ICD-10-CM | POA: Diagnosis not present

## 2023-10-27 DIAGNOSIS — D62 Acute posthemorrhagic anemia: Secondary | ICD-10-CM | POA: Diagnosis not present

## 2023-10-27 DIAGNOSIS — Z1212 Encounter for screening for malignant neoplasm of rectum: Secondary | ICD-10-CM | POA: Diagnosis not present

## 2023-10-27 DIAGNOSIS — E785 Hyperlipidemia, unspecified: Secondary | ICD-10-CM | POA: Diagnosis not present

## 2023-11-03 DIAGNOSIS — R0789 Other chest pain: Secondary | ICD-10-CM | POA: Diagnosis not present

## 2023-11-03 DIAGNOSIS — K219 Gastro-esophageal reflux disease without esophagitis: Secondary | ICD-10-CM | POA: Diagnosis not present

## 2023-11-03 DIAGNOSIS — M1612 Unilateral primary osteoarthritis, left hip: Secondary | ICD-10-CM | POA: Diagnosis not present

## 2023-11-03 DIAGNOSIS — G720 Drug-induced myopathy: Secondary | ICD-10-CM | POA: Diagnosis not present

## 2023-11-03 DIAGNOSIS — Z0000 Encounter for general adult medical examination without abnormal findings: Secondary | ICD-10-CM | POA: Diagnosis not present

## 2023-11-03 DIAGNOSIS — I10 Essential (primary) hypertension: Secondary | ICD-10-CM | POA: Diagnosis not present

## 2023-11-03 DIAGNOSIS — E785 Hyperlipidemia, unspecified: Secondary | ICD-10-CM | POA: Diagnosis not present

## 2023-11-03 DIAGNOSIS — M961 Postlaminectomy syndrome, not elsewhere classified: Secondary | ICD-10-CM | POA: Diagnosis not present

## 2023-11-03 DIAGNOSIS — R82998 Other abnormal findings in urine: Secondary | ICD-10-CM | POA: Diagnosis not present

## 2024-01-13 DIAGNOSIS — H61002 Unspecified perichondritis of left external ear: Secondary | ICD-10-CM | POA: Diagnosis not present

## 2024-01-13 DIAGNOSIS — L821 Other seborrheic keratosis: Secondary | ICD-10-CM | POA: Diagnosis not present

## 2024-01-13 DIAGNOSIS — Z85828 Personal history of other malignant neoplasm of skin: Secondary | ICD-10-CM | POA: Diagnosis not present

## 2024-01-13 DIAGNOSIS — L570 Actinic keratosis: Secondary | ICD-10-CM | POA: Diagnosis not present

## 2024-02-27 DIAGNOSIS — Z23 Encounter for immunization: Secondary | ICD-10-CM | POA: Diagnosis not present

## 2024-04-13 ENCOUNTER — Other Ambulatory Visit: Payer: Self-pay | Admitting: Internal Medicine

## 2024-04-13 DIAGNOSIS — Z1231 Encounter for screening mammogram for malignant neoplasm of breast: Secondary | ICD-10-CM

## 2024-04-21 ENCOUNTER — Inpatient Hospital Stay: Admission: RE | Admit: 2024-04-21 | Discharge: 2024-04-21 | Attending: Internal Medicine | Admitting: Internal Medicine

## 2024-04-21 DIAGNOSIS — Z1231 Encounter for screening mammogram for malignant neoplasm of breast: Secondary | ICD-10-CM
